# Patient Record
Sex: Male | Born: 1985 | Race: White | Hispanic: No | Marital: Married | Smoking: Former smoker
Health system: Southern US, Community
[De-identification: ages and names within clinical notes are randomized; demographics above are authoritative.]

## PROBLEM LIST (undated history)

## (undated) DIAGNOSIS — N200 Calculus of kidney: Secondary | ICD-10-CM

## (undated) DIAGNOSIS — L309 Dermatitis, unspecified: Secondary | ICD-10-CM

## (undated) HISTORY — PX: ANKLE SURGERY: SHX546

## (undated) HISTORY — DX: Dermatitis, unspecified: L30.9

---
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
## 2004-05-25 ENCOUNTER — Emergency Department (HOSPITAL_COMMUNITY): Admission: EM | Admit: 2004-05-25 | Discharge: 2004-05-25 | Payer: Self-pay | Admitting: *Deleted

## 2004-06-10 ENCOUNTER — Ambulatory Visit (HOSPITAL_COMMUNITY): Admission: RE | Admit: 2004-06-10 | Discharge: 2004-06-10 | Payer: Self-pay | Admitting: Orthopedic Surgery

## 2004-08-18 ENCOUNTER — Ambulatory Visit: Payer: Self-pay | Admitting: Orthopedic Surgery

## 2004-09-02 ENCOUNTER — Ambulatory Visit: Payer: Self-pay | Admitting: Orthopedic Surgery

## 2004-09-02 ENCOUNTER — Ambulatory Visit (HOSPITAL_COMMUNITY): Admission: RE | Admit: 2004-09-02 | Discharge: 2004-09-02 | Payer: Self-pay | Admitting: Orthopedic Surgery

## 2004-09-08 ENCOUNTER — Ambulatory Visit: Payer: Self-pay | Admitting: Orthopedic Surgery

## 2004-09-22 ENCOUNTER — Ambulatory Visit: Payer: Self-pay | Admitting: Orthopedic Surgery

## 2004-10-08 ENCOUNTER — Ambulatory Visit: Payer: Self-pay | Admitting: Orthopedic Surgery

## 2004-10-20 ENCOUNTER — Ambulatory Visit: Payer: Self-pay | Admitting: Orthopedic Surgery

## 2004-11-03 ENCOUNTER — Ambulatory Visit: Payer: Self-pay | Admitting: Orthopedic Surgery

## 2004-12-01 ENCOUNTER — Ambulatory Visit: Payer: Self-pay | Admitting: Orthopedic Surgery

## 2005-03-31 IMAGING — RF DG ANKLE PORT 2V*R*
1 series · 5 of 5 positions shown · non-contrast
Comparison: none

CLINICAL DATA: Right ankle fracture-dislocation.  ORIF.
PORTABLE RIGHT ANKLE ? 06/10/04 
Five spot fluoroscopic images are submitted from the operating room and correlated with radiographs done 05/25/04.  These demonstrate anatomic reduction of the distal fibular fracture by a lateral plate and screws.  Two cortical screws have reduced the medial malleolar fracture.  On the final image, which is a lateral view, there appears to be a third cortical screw traversing the distal tibiofibular joint.  This is not imaged in the AP projection.  
IMPRESSION
Near anatomic reduction of distal fibular and medial malleolar fractures.

[Series 935: run · 5 of 5 slices shown]
[im 1/5]
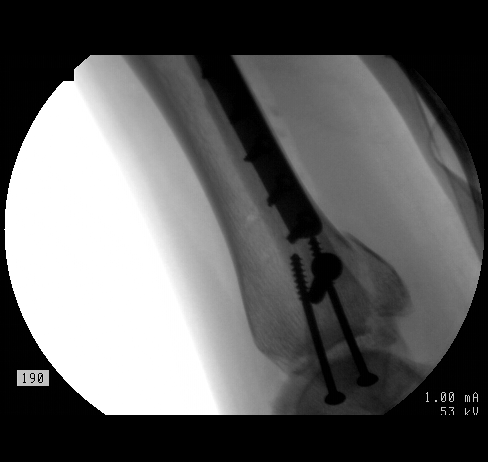
[im 2/5]
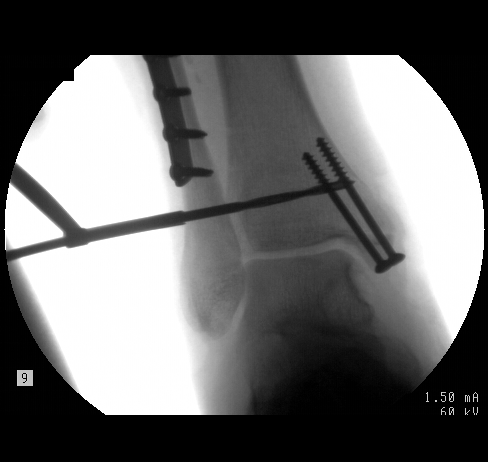
[im 3/5]
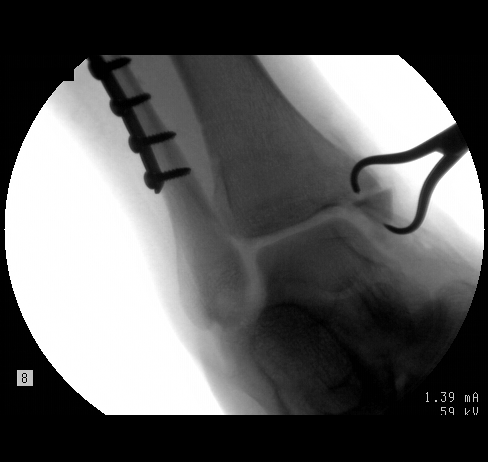
[im 4/5]
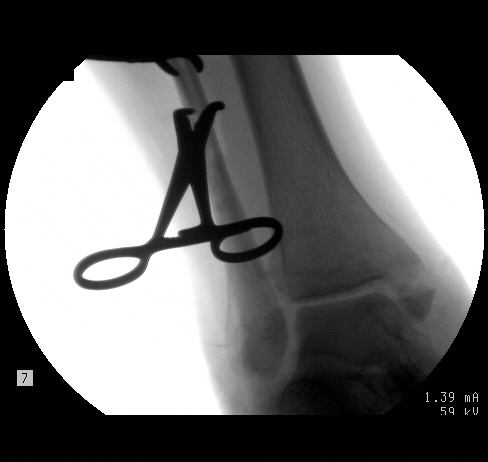
[im 5/5]
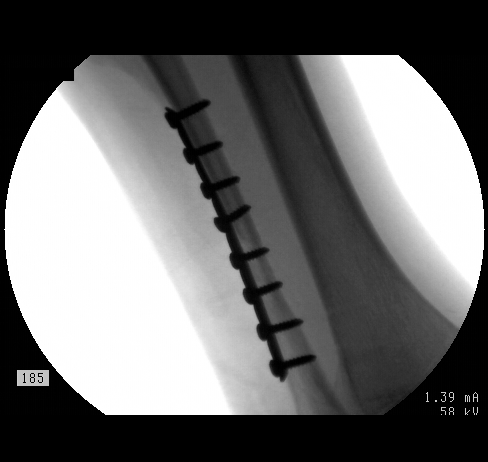

[5 of 5 positions shown; findings below may reference images not displayed]

## 2005-05-13 ENCOUNTER — Ambulatory Visit: Payer: Self-pay | Admitting: Orthopedic Surgery

## 2006-06-07 ENCOUNTER — Emergency Department (HOSPITAL_COMMUNITY): Admission: EM | Admit: 2006-06-07 | Discharge: 2006-06-07 | Payer: Self-pay | Admitting: Emergency Medicine

## 2006-07-23 ENCOUNTER — Encounter: Admission: RE | Admit: 2006-07-23 | Discharge: 2006-10-21 | Payer: Self-pay | Admitting: Orthopedic Surgery

## 2006-07-23 ENCOUNTER — Emergency Department (HOSPITAL_COMMUNITY): Admission: EM | Admit: 2006-07-23 | Discharge: 2006-07-23 | Payer: Self-pay | Admitting: Emergency Medicine

## 2008-02-18 ENCOUNTER — Emergency Department (HOSPITAL_COMMUNITY): Admission: EM | Admit: 2008-02-18 | Discharge: 2008-02-18 | Payer: Self-pay | Admitting: Emergency Medicine

## 2011-02-27 NOTE — H&P (Signed)
NAME:  Antonio Black, Antonio Black                          ACCOUNT NO.:  [id number]   MEDICAL RECORD NO.:  [id number]                   PATIENT TYPE:  AMB   LOCATION:  DAY                                  FACILITY:  [facility]   PHYSICIAN:  Vickki Hearing, M.D.           DATE OF BIRTH:  Jun 16, 1986   DATE OF ADMISSION:  DATE OF DISCHARGE:                                HISTORY & PHYSICAL   No dictation for this job.     ___________________________________________                                         Vickki Hearing, M.D.   SEH/MEDQ  D:  06/04/2004  T:  06/04/2004  Job:  [id number]

## 2011-02-27 NOTE — H&P (Signed)
NAME:  Antonio Black, MACPHERSON                          ACCOUNT NO.:  [id number]   MEDICAL RECORD NO.:  [id number]                   PATIENT TYPE:  INP   LOCATION:  A316                                 FACILITY:  [facility]   PHYSICIAN:  Dennie Maizes, M.D.                DATE OF BIRTH:  1986-02-21   DATE OF ADMISSION:  05/09/2002  DATE OF DISCHARGE:                                HISTORY & PHYSICAL   CHIEF COMPLAINT:  Severe right flank pain radiating to the front, nausea,  and vomiting.   HISTORY OF PRESENT ILLNESS:  This 25 year old male has been having  intermittent right flank pain of moderate severity for the past 10 days.  His pain became very severe a few days ago.  He was seen in the [facility] by Ellie Lunch, M.D.  An x-ray of the KUB area failed to reveal  any stone.  He was referred to me for further evaluation.  I saw the patient  in the office today.  A noncontrast CT scan of the abdomen and pelvis was  done.  The patient had severe recurrent pain associated with nausea and  vomiting.  He was admitted to the hospital for further evaluation and  management.  There is no past history of urolithiasis.  He did not have any  voiding difficulty, gross hematuria, fever, chills, or dysuria.   The noncontrast CT scan of the abdomen and pelvis revealed a 3 mm size,  noncontrast left renal calculus.  There was a 3 mm size right distal  ureteral calculus at the level of the ureterosacral junction with  obstruction.   PAST MEDICAL HISTORY:  Unremarkable.   MEDICATIONS:  None.   ALLERGIES:  AMOXICILLIN.   FAMILY HISTORY:  Strongly positive for recurrent ureterolithiasis.  There  also is a family history of hypertension, diabetes mellitus, and cancer of  the lymph  nodes.   PHYSICAL EXAMINATION:  The patient is noted to be in severe pain.   HEENT:  Normal.   NECK:  No masses.   LUNGS:  Clear to auscultation.   HEART:  Regular rate and rhythm.  No murmurs.   ABDOMEN:   Soft.  No palpable flank mass.  Moderate right costovertebral  angle tenderness is noted.   LABORATORY DATA:  Pending.   IMPRESSION:  1. Right renal colic.  2. Right distal ureteral calculus with obstruction.  3. Left renal calculus.   PLAN:  1. Admit to the patient to the hospital.  2. IV fluids.  3. Narcotics.  4. Strain all urine for stones.   I discussed with the patient and his mother regarding the management  options.  As he has had severe pain for several days, he would like to  proceed with endoscopic stone removal.  He is scheduled to undergo  cystoscopy, right retrograde pyelogram, right urethroscopic stone  extraction, and right urethral stent place on  May 10, 2002.  I have  explained to the patient and his mother regarding the diagnosis, operative  details, alterative treatments, and all possible risks and complications and  they have agree for the procedure to be done.  Inability to remove the stone  due to stone migration has been explained to the patient.                                               Dennie Maizes, M.D.    SK/MEDQ  D:  05/09/2002  T:  05/09/2002  Job:  [id number]   cc:   Ellie Lunch, M.D.   [facility]
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
## 2011-02-27 NOTE — Op Note (Signed)
[name], [name] NO.:  [id number]   MEDICAL RECORD NO.:  [id number]          PATIENT TYPE:  AMB   LOCATION:  DAY                           FACILITY:  [facility]   PHYSICIAN:  Vickki Hearing, M.D.DATE OF BIRTH:  11-28-1985   DATE OF PROCEDURE:  09/02/2004  DATE OF DISCHARGE:                                 OPERATIVE REPORT   PREOPERATIVE DIAGNOSIS:  Fractured right ankle, orthopedic implant.   POSTOPERATIVE DIAGNOSIS:  Fractured right ankle, orthopedic implant.   PROCEDURE:  Removal of syndesmotic screw.   SURGEON:  Vickki Hearing, M.D.   No assistants.  No specimens.   ESTIMATED BLOOD LOSS:  Minimal.   TOURNIQUET TIME:  11 minutes.   No complications. The patient went to PACU in good condition.   DETAILS OF PROCEDURE:  Antonio Black was identified in the preop holding area  and marked his right ankle as the operative site.  My initials were signed  in the same spot.  He was given preoperative antibiotics, taken to the  operating room for general anesthetic after sterile prep and drape.  Time  out was taken and the procedure and limb and patient identity were  confirmed.   The tourniquet was elevated.  A mini-C-arm was used to identify the location  of the screw.  An incision was made over that location.  The screw was  removed.  Just prior to removing the screw from the fibula, a rotation check  was made to check syndesmotic integrity.  There was no motion in the  syndesmosis, and the screw was removed from the fibula.  The wound was  irrigated and closed with 2-0 Vicryl and staples, injected with 10 mL of  0.5% plain Sensorcaine.  A sterile dressing was applied and the tourniquet  was released.  The patient was extubated and taken to the recovery room in  good condition.     Weyman Croon   SEH/MEDQ  D:  09/02/2004  T:  09/02/2004  Job:  [id number]
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
## 2011-02-27 NOTE — Op Note (Signed)
NAME:  Antonio Black, Antonio Black                          ACCOUNT NO.:  [id number]   MEDICAL RECORD NO.:  [id number]                   PATIENT TYPE:  INP   LOCATION:  A316                                 FACILITY:  [facility]   PHYSICIAN:  Dennie Maizes, M.D.                DATE OF BIRTH:  1986-04-03   DATE OF PROCEDURE:  05/10/2002  DATE OF DISCHARGE:  05/11/2002                                 OPERATIVE REPORT   PREOPERATIVE DIAGNOSIS:  Right distal ureteral calculus with obstruction,  right renal colic.   POSTOPERATIVE DIAGNOSIS:  Right distal ureteral calculus with obstruction,  right renal colic.   OPERATIVE PROCEDURE:  Cystoscopy, right retrograde pyelogram, ureteroscopy  with stone extraction and right ureteral stent placement.   ANESTHESIA:  General.   SURGEON:  Dennie Maizes, M.D.   COMPLICATIONS:  None.   INDICATIONS:  This 25 year old male was admitted to the hospital with right  renal colic.  Evaluation revealed a 3 mm sized right distal ureteral  calculus with obstruction and mild hydronephrosis.  The patient was taken to  the OR and cleared for cystoscopy, right retrograde pyelogram, ureteroscopy,  stone extraction and ureteral stent placement.   DESCRIPTION OF PROCEDURE:  General anesthesia was induced and the patient  was placed on the OR table in the dorsal lithotomy position.  The lower  abdomen and genitalia were prepped and draped in a sterile fashion.  Cystoscopy was done with a 22-French scope.  Ureter, prostate and bladder  were normal.  There was prominence of the right intramural ureter with edema  and erythema.  A 5-French Burch catheter was then placed in the right  ureteral orifice.  About 7 cc of Renografin-60 were injected into collecting  system and a retrograde pyelogram was done; this revealed a small filling  defect in the distal ureter about 2 cm above the ureteral orifice.  The  collecting system above the filling defect was noted to be mildly  dilated.   A 5-French open-ended catheter was then placed in the right ureteral  orifice.  A 0.038 inch Bentson guidewire with a flexible tip was then  advanced into the renal pelvis without any difficulty.  Distal ureter was  then dilated using a 18-French 4 cm sized balloon dilating catheter.  The  guidewire was left in place and the balloon dilating catheter was removed.   Ureteroscopy was done with the rigid ureteroscope.  A 3 mm sized stone was  seen in the distal ureter.  A #6 wire 11 mm sized basket was then inserted  into the distal ureter.  The stone was trapped inside the basket and removed  without any difficulty.  A 6-French 26 cm sized stent with a string was then  inserted into the right collecting system.  The instruments were removed.   The patient was transferred to the PACU in a satisfactory condition.  Dennie Maizes, M.D.    SK/MEDQ  D:  05/10/2002  T:  05/15/2002  Job:  [id number]

## 2011-07-08 LAB — DIFFERENTIAL
Basophils Absolute: 0.1
Eosinophils Absolute: 0.2
Eosinophils Relative: 3
Monocytes Absolute: 0.5
Monocytes Relative: 7

## 2011-07-08 LAB — URINALYSIS, ROUTINE W REFLEX MICROSCOPIC
Leukocytes, UA: NEGATIVE
Protein, ur: 30 — AB
Specific Gravity, Urine: 1.025

## 2011-07-08 LAB — CBC
HCT: 42.4
Hemoglobin: 15.1

## 2011-07-08 LAB — URINE MICROSCOPIC-ADD ON

## 2011-07-08 LAB — BASIC METABOLIC PANEL
CO2: 27
Calcium: 9.1
Chloride: 106
Creatinine, Ser: 0.91
GFR calc non Af Amer: >60
Glucose, Bld: 99
Potassium: 3.8
Sodium: 139

## 2012-08-20 ENCOUNTER — Emergency Department (HOSPITAL_COMMUNITY): Payer: BC Managed Care – PPO

## 2012-08-20 ENCOUNTER — Emergency Department (HOSPITAL_COMMUNITY)
Admission: EM | Admit: 2012-08-20 | Discharge: 2012-08-20 | Disposition: A | Discharge status: Home / Self Care | Payer: BC Managed Care – PPO | Attending: Emergency Medicine | Admitting: Emergency Medicine

## 2012-08-20 ENCOUNTER — Encounter (HOSPITAL_COMMUNITY): Payer: Self-pay | Admitting: *Deleted

## 2012-08-20 DIAGNOSIS — F17200 Nicotine dependence, unspecified, uncomplicated: Secondary | ICD-10-CM | POA: Insufficient documentation

## 2012-08-20 DIAGNOSIS — Z87442 Personal history of urinary calculi: Secondary | ICD-10-CM | POA: Insufficient documentation

## 2012-08-20 DIAGNOSIS — N200 Calculus of kidney: Secondary | ICD-10-CM | POA: Insufficient documentation

## 2012-08-20 HISTORY — DX: Calculus of kidney: N20.0

## 2012-08-20 LAB — URINALYSIS, ROUTINE W REFLEX MICROSCOPIC
Bilirubin Urine: NEGATIVE
Glucose, UA: NEGATIVE mg/dL
Ketones, ur: NEGATIVE mg/dL
Protein, ur: NEGATIVE mg/dL
Specific Gravity, Urine: 1.015 (ref 1.005–1.030)
pH: 6 (ref 5.0–8.0)

## 2012-08-20 LAB — URINE MICROSCOPIC-ADD ON

## 2012-08-20 MED ORDER — PERCOCET 5-325 MG PO TABS: ORAL_TABLET | ORAL | Status: DC

## 2012-08-20 MED ORDER — ONDANSETRON 8 MG PO TBDP
8.0000 mg | ORAL_TABLET | Freq: Once | ORAL | Status: AC
  Administered 2012-08-20: 8 mg via ORAL
  Filled 2012-08-20: qty 1

## 2012-08-20 MED ORDER — PROMETHAZINE HCL 25 MG RE SUPP: 25.0000 mg | Freq: Four times a day (QID) | RECTAL | Status: DC | PRN

## 2012-08-20 MED ORDER — PROMETHAZINE HCL 25 MG PO TABS: 25.0000 mg | ORAL_TABLET | Freq: Three times a day (TID) | ORAL | Status: DC | PRN

## 2012-08-20 MED ORDER — TAMSULOSIN HCL 0.4 MG PO CAPS: ORAL_CAPSULE | ORAL | Status: DC

## 2012-08-20 MED ORDER — MORPHINE SULFATE 4 MG/ML IJ SOLN
4.0000 mg | Freq: Once | INTRAMUSCULAR | Status: AC
  Administered 2012-08-20: 4 mg via INTRAMUSCULAR
  Filled 2012-08-20: qty 1

## 2012-08-20 NOTE — ED Notes (Signed)
Pt woke up with left flank pain this morning. Hx of kidney stones and has noticed blood in his urine previous 3 days but not today. Nausea this morning.

## 2012-08-20 NOTE — ED Provider Notes (Signed)
History     CSN: [id number]  Arrival date & time 08/20/12  1323   First MD Initiated Contact with Patient 08/20/12 1434      Chief Complaint  Patient presents with  . Flank Pain    (Consider location/radiation/quality/duration/timing/severity/associated sxs/prior treatment) HPI  Patient relates a history of renal calculi and has had lithotripsy and stents in the past. He states his stones have always been on the right, however he was told he did have a stone on the left but they were just watching. He states about 10 AM today started having pain in his left flank that would not get better. The pain was described as sharp. He relates however on the way to the ED the pain did improve however it is not gone. He states his pain was a 6/10 at worst and is currently at 2/10. He states certain movements make it feel worse and sitting still makes it feel better. He has had  nausea without vomiting, he did have some diaphoresis, his girl friend states he was pale. He states it feels like his prior stones except this time it on the left side. He denies radiation into his abdomen or groin.   PCP [facility] in [place] Urologist was Dr. Rito Ehrlich  Past Medical History  Diagnosis Date  . Kidney stones     Past Surgical History  Procedure Date  . Ankle surgery     No family history on file.  History  Substance Use Topics  . Smoking status: Current Every Day Smoker  . Smokeless tobacco: Not on file  . Alcohol Use: occassional   employed   Review of Systems  All other systems reviewed and are negative.    Allergies  Amoxicillin  Home Medications   Current Outpatient Rx  Name  Route  Sig  Dispense  Refill  . ONE-DAILY MULTI VITAMINS PO TABS   Oral   Take 1 tablet by mouth daily.           BP 123/67  Pulse 77  Temp 98.1 F (36.7 C) (Oral)  Resp 16  Ht 5\' 11"  (1.803 m)  Wt 205 lb (92.987 kg)  BMI 28.59 kg/m2  SpO2 100%  Vital signs normal    Physical  Exam  Nursing note and vitals reviewed. Constitutional: He is oriented to person, place, and time. He appears well-developed and well-nourished.  Non-toxic appearance. He does not appear ill. No distress.  HENT:  Head: Normocephalic and atraumatic.  Right Ear: External ear normal.  Left Ear: External ear normal.  Nose: Nose normal. No mucosal edema or rhinorrhea.  Mouth/Throat: Oropharynx is clear and moist and mucous membranes are normal. No dental abscesses or uvula swelling.  Eyes: Conjunctivae normal and EOM are normal. Pupils are equal, round, and reactive to light.  Neck: Normal range of motion and full passive range of motion without pain. Neck supple.  Cardiovascular: Normal rate, regular rhythm and normal heart sounds.  Exam reveals no gallop and no friction rub.   No murmur heard. Pulmonary/Chest: Effort normal and breath sounds normal. No respiratory distress. He has no wheezes. He has no rhonchi. He has no rales. He exhibits no tenderness and no crepitus.  Abdominal: Soft. Normal appearance and bowel sounds are normal. He exhibits no distension. There is no tenderness. There is no rebound and no guarding.  Genitourinary:       Some pain in left flank  Musculoskeletal: Normal range of motion. He exhibits no edema and  no tenderness.       Moves all extremities well.   Neurological: He is alert and oriented to person, place, and time. He has normal strength. No cranial nerve deficit.  Skin: Skin is warm, dry and intact. No rash noted. No erythema. There is pallor.  Psychiatric: He has a normal mood and affect. His speech is normal and behavior is normal. His mood appears not anxious.    ED Course  Procedures (including critical care time)   Medications  Multiple Vitamin (MULTIVITAMIN) tablet (not administered)  morphine 4 MG/ML injection 4 mg (4 mg Intramuscular Given 08/20/12 1451)  ondansetron (ZOFRAN-ODT) disintegrating tablet 8 mg (8 mg Oral Given 08/20/12 1451)      Discussed with patient about the best test to do today. He has had at least AP CT x 3 in the past. Since his pain is improved he is agreeable to getting an xray, knowing it isn't the best test, and coming back tomorrow to get an [place]. However, we discussed if his pain seems worse, we can also consider getting a CT scan.  Review of his CT scan from 2009 shows a 4 mm nonobstructing stone in the left kidney that was bigger than the prior scan in 2003.  Pt aware of these scan results today.    Recheck at 16:10 pt states his pain is a 0.5/10 and is ready to go home. Have discussed his xray results. Understands to return if he gets worse.   Results for orders placed during the hospital encounter of 08/20/12  URINALYSIS, ROUTINE W REFLEX MICROSCOPIC      Component Value Range   Color, Urine YELLOW  YELLOW   APPearance CLEAR  CLEAR   Specific Gravity, Urine 1.015  1.005 - 1.030   pH 6.0  5.0 - 8.0   Glucose, UA NEGATIVE  NEGATIVE mg/dL   Hgb urine dipstick LARGE (*) NEGATIVE   Bilirubin Urine NEGATIVE  NEGATIVE   Ketones, ur NEGATIVE  NEGATIVE mg/dL   Protein, ur NEGATIVE  NEGATIVE mg/dL   Urobilinogen, UA 0.2  0.0 - 1.0 mg/dL   Nitrite NEGATIVE  NEGATIVE   Leukocytes, UA NEGATIVE  NEGATIVE  URINE MICROSCOPIC-ADD ON      Component Value Range   WBC, UA 3-6  <3 WBC/hpf   RBC / HPF TOO NUMEROUS TO COUNT  <3 RBC/hpf   Laboratory interpretation all normal except hematuria    Dg Abd 1 View  08/20/2012  *RADIOLOGY REPORT*  Clinical Data: Left flank pain.  History of kidney stone.  ABDOMEN - 1 VIEW  Comparison: CT 02/18/2008  Findings: Again demonstrated is a 6 mm stone in the lower pole of the left kidney.  No other urinary tract calcification is evident. No stones seen along the course of either ureter.  There is a small phlebolith in the right pelvis.  There is mild curvature of the spine.  IMPRESSION: 6 mm stone in the lower pole of the left kidney, unchanged in position from the CT scan of  2009.  No other sign of urinary tract stone.   Original Report Authenticated By: Paulina Fusi, M.D.      1. Renal calculus or stone     New Prescriptions   PERCOCET 5-325 MG PER TABLET    Take 1 or 2 po Q 6hrs for pain   PROMETHAZINE (PHENERGAN) 25 MG SUPPOSITORY    Place 1 suppository (25 mg total) rectally every 6 (six) hours as needed for nausea.   PROMETHAZINE (PHENERGAN)  25 MG TABLET    Take 1 tablet (25 mg total) by mouth every 8 (eight) hours as needed for nausea.   TAMSULOSIN HCL (FLOMAX) 0.4 MG CAPS    Take 1 po QD until you pass the stone.    Plan discharge to return at 9 am for outpatient renal US.   Devoria Albe, MD, FACEP    MDM          Ward Givens, MD 08/20/12 [phone]

## 2012-08-21 ENCOUNTER — Ambulatory Visit (HOSPITAL_COMMUNITY)
Admit: 2012-08-21 | Discharge: 2012-08-21 | Disposition: A | Discharge status: Home / Self Care | Payer: BC Managed Care – PPO | Attending: Emergency Medicine | Admitting: Emergency Medicine

## 2012-08-21 DIAGNOSIS — R109 Unspecified abdominal pain: Secondary | ICD-10-CM | POA: Insufficient documentation

## 2012-08-21 DIAGNOSIS — Z87442 Personal history of urinary calculi: Secondary | ICD-10-CM | POA: Insufficient documentation

## 2012-08-30 ENCOUNTER — Emergency Department (HOSPITAL_BASED_OUTPATIENT_CLINIC_OR_DEPARTMENT_OTHER)
Admission: EM | Admit: 2012-08-30 | Discharge: 2012-08-30 | Disposition: A | Discharge status: Home / Self Care | Payer: BC Managed Care – PPO | Attending: Emergency Medicine | Admitting: Emergency Medicine

## 2012-08-30 ENCOUNTER — Emergency Department (HOSPITAL_BASED_OUTPATIENT_CLINIC_OR_DEPARTMENT_OTHER): Payer: BC Managed Care – PPO

## 2012-08-30 ENCOUNTER — Encounter (HOSPITAL_BASED_OUTPATIENT_CLINIC_OR_DEPARTMENT_OTHER): Payer: Self-pay

## 2012-08-30 DIAGNOSIS — S93409A Sprain of unspecified ligament of unspecified ankle, initial encounter: Secondary | ICD-10-CM | POA: Insufficient documentation

## 2012-08-30 DIAGNOSIS — Y929 Unspecified place or not applicable: Secondary | ICD-10-CM | POA: Insufficient documentation

## 2012-08-30 DIAGNOSIS — Y939 Activity, unspecified: Secondary | ICD-10-CM | POA: Insufficient documentation

## 2012-08-30 DIAGNOSIS — F17200 Nicotine dependence, unspecified, uncomplicated: Secondary | ICD-10-CM | POA: Insufficient documentation

## 2012-08-30 DIAGNOSIS — X500XXA Overexertion from strenuous movement or load, initial encounter: Secondary | ICD-10-CM | POA: Insufficient documentation

## 2012-08-30 DIAGNOSIS — N200 Calculus of kidney: Secondary | ICD-10-CM | POA: Insufficient documentation
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
## 2012-12-29 ENCOUNTER — Telehealth: Payer: Self-pay | Admitting: Physician Assistant

## 2012-12-29 ENCOUNTER — Encounter: Payer: Self-pay | Admitting: Physician Assistant

## 2012-12-29 ENCOUNTER — Ambulatory Visit (INDEPENDENT_AMBULATORY_CARE_PROVIDER_SITE_OTHER): Payer: BC Managed Care – PPO | Admitting: Physician Assistant

## 2012-12-29 VITALS — BP 113/71 | HR 74 | Temp 97.8°F | Ht 70.5 in | Wt 199.6 lb

## 2012-12-29 DIAGNOSIS — J329 Chronic sinusitis, unspecified: Secondary | ICD-10-CM

## 2012-12-29 MED ORDER — AMOXICILLIN 875 MG PO TABS: 875.0000 mg | ORAL_TABLET | Freq: Two times a day (BID) | ORAL | Status: DC

## 2012-12-29 MED ORDER — FLUTICASONE PROPIONATE 50 MCG/ACT NA SUSP: 2.0000 | Freq: Every day | NASAL | Status: DC

## 2012-12-29 NOTE — Progress Notes (Signed)
  Subjective:    Patient ID: DAHLTON HINDE, male    DOB: 08/08/1986, 27 y.o.   MRN: [id number]  HPI Head congestion, dry cough, sore throat, poor sleep 2 days   Review of Systems  Constitutional: Positive for chills, activity change and fatigue.  HENT: Positive for congestion, sore throat, rhinorrhea, postnasal drip and sinus pressure. Negative for hearing loss, nosebleeds and tinnitus.   Eyes: Negative.   Respiratory: Positive for cough. Negative for shortness of breath and wheezing.   Cardiovascular: Negative.   Gastrointestinal: Negative.   Genitourinary: Negative.   All other systems reviewed and are negative.       Objective:   Physical Exam  Constitutional: He is oriented to person, place, and time. He appears well-developed and well-nourished.  HENT:  maxofacial tenderness, TMs retracted, pharynx injected  Eyes: Conjunctivae are normal. Pupils are equal, round, and reactive to light.  Neck: Normal range of motion. Neck supple.  Cardiovascular: Normal rate, regular rhythm and normal heart sounds.   Pulmonary/Chest:  Dry cough, worse at night  Neurological: He is alert and oriented to person, place, and time.  Skin: Skin is warm and dry.  Psychiatric: He has a normal mood and affect.          Assessment & Plan:  Sinusitis

## 2012-12-29 NOTE — Telephone Encounter (Signed)
Antonio Black gave him amoxicillin.  He is allergic to it.  Please call something else in to [facility]

## 2012-12-30 ENCOUNTER — Other Ambulatory Visit: Payer: Self-pay | Admitting: Physician Assistant

## 2012-12-30 DIAGNOSIS — J329 Chronic sinusitis, unspecified: Secondary | ICD-10-CM

## 2012-12-30 MED ORDER — AZITHROMYCIN 250 MG PO TABS: ORAL_TABLET | ORAL | Status: DC

## 2012-12-30 NOTE — Telephone Encounter (Signed)
z-pack printed; fax to [facility]

## 2012-12-30 NOTE — Telephone Encounter (Signed)
Z-pak called to [facility] in [place].  Patient aware.

## 2013-03-01 ENCOUNTER — Telehealth: Payer: Self-pay | Admitting: Nurse Practitioner

## 2013-03-01 NOTE — Telephone Encounter (Signed)
APPT MADE

## 2013-03-02 ENCOUNTER — Encounter: Payer: Self-pay | Admitting: Physician Assistant

## 2013-03-02 ENCOUNTER — Ambulatory Visit (INDEPENDENT_AMBULATORY_CARE_PROVIDER_SITE_OTHER): Payer: BC Managed Care – PPO | Admitting: Physician Assistant

## 2013-03-02 VITALS — BP 119/74 | HR 76 | Temp 97.9°F | Ht 70.0 in | Wt 198.2 lb

## 2013-03-02 DIAGNOSIS — L259 Unspecified contact dermatitis, unspecified cause: Secondary | ICD-10-CM

## 2013-03-02 DIAGNOSIS — L309 Dermatitis, unspecified: Secondary | ICD-10-CM

## 2013-03-02 NOTE — Progress Notes (Signed)
Subjective:     Patient ID: Antonio Black, male   DOB: [birth year], 27 y.o.   MRN: [id number]  HPI Pt with a 1 yr hx of rash to the ant tib area on the L leg He denies any real trauma to the area + pruritus to the area Over the last yr it has increased in size He has used lotion and OTC hydrocort which he states makes the area darker but it never resolves No FH of skin disorders  Review of Systems  All other systems reviewed and are negative.       Objective:   Physical Exam Large eythem lesion to the L ant shin Well dermarc. Edges No ulceration. + scale Smaller lesion to the R ant shin    Assessment:     1. Dermatitis        Plan:     Referral to Derm He can use Hydrocort prn Appt to [facility]

## 2013-03-02 NOTE — Patient Instructions (Signed)
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
## 2014-06-14 ENCOUNTER — Encounter (INDEPENDENT_AMBULATORY_CARE_PROVIDER_SITE_OTHER): Payer: Self-pay

## 2014-06-14 ENCOUNTER — Ambulatory Visit (INDEPENDENT_AMBULATORY_CARE_PROVIDER_SITE_OTHER): Payer: BC Managed Care – PPO | Admitting: Family

## 2014-06-14 ENCOUNTER — Encounter: Payer: Self-pay | Admitting: Family

## 2014-06-14 VITALS — BP 115/66 | HR 62 | Temp 99.3°F | Ht 70.0 in | Wt 182.6 lb

## 2014-06-14 DIAGNOSIS — B353 Tinea pedis: Secondary | ICD-10-CM

## 2014-06-14 MED ORDER — FLUCONAZOLE 150 MG PO TABS: 150.0000 mg | ORAL_TABLET | ORAL | Status: DC

## 2014-06-14 MED ORDER — CLOTRIMAZOLE 1 % EX CREA: 1.0000 "application " | TOPICAL_CREAM | Freq: Two times a day (BID) | CUTANEOUS | Status: DC

## 2014-06-14 NOTE — Patient Instructions (Signed)

## 2014-06-14 NOTE — Progress Notes (Signed)
   Subjective:    Patient ID: Antonio Black, male    DOB: Mar 06, 1986, 28 y.o.   MRN: [id number]  HPI Pt presents to the office for a yellow fungus on bottom of bilateral feet. Pt states it started a few years ago, but it was smaller and only on one foot. Pt states it just has gotten worse. Pt denies any itching. Pt states the right foot is slightly tender when he walks. Pt states he has tried to "scrap the dry skin" , but it just grows back.    Review of Systems  Constitutional: Negative.   HENT: Negative.   Respiratory: Negative.   Cardiovascular: Negative.   Gastrointestinal: Negative.   Endocrine: Negative.   Genitourinary: Negative.   Musculoskeletal: Negative.   Neurological: Negative.   Hematological: Negative.   Psychiatric/Behavioral: Negative.   All other systems reviewed and are negative.      Objective:   Physical Exam  Vitals reviewed. Constitutional: He is oriented to person, place, and time. He appears well-developed and well-nourished. No distress.  Eyes: Pupils are equal, round, and reactive to light. Right eye exhibits no discharge. Left eye exhibits no discharge.  Neck: Normal range of motion. Neck supple. No thyromegaly present.  Cardiovascular: Normal rate, regular rhythm, normal heart sounds and intact distal pulses.   No murmur heard. Pulmonary/Chest: Effort normal and breath sounds normal. No respiratory distress. He has no wheezes.  Abdominal: Soft. Bowel sounds are normal. He exhibits no distension. There is no tenderness.  Musculoskeletal: Normal range of motion. He exhibits no edema and no tenderness.  Neurological: He is alert and oriented to person, place, and time. He has normal reflexes. No cranial nerve deficit.  Skin: Skin is warm and dry. No erythema.  Dry yellowish callus like on bilateral plantars  Psychiatric: He has a normal mood and affect. His behavior is normal. Judgment and thought content normal.    BP 115/66  Pulse 62  Temp(Src)  99.3 F (37.4 C) (Oral)  Ht  (1.778 m)  Wt 182 lb 9.6 oz (82.827 kg)  BMI 26.20 kg/m2       Assessment & Plan:  1. Tinea pedis of both feet -Keep feet clean and dry -Wash socks and shoes -Do not walk around without shoes - clotrimazole (LOTRIMIN) 1 % cream; Apply 1 application topically 2 (two) times daily.  Dispense: 30 g; Refill: 0 - fluconazole (DIFLUCAN) 150 MG tablet; Take 1 tablet (150 mg total) by mouth once a week.  Dispense: 5 tablet; Refill: 0   Jannifer Rodney, FNP

## 2016-06-11 DIAGNOSIS — J039 Acute tonsillitis, unspecified: Secondary | ICD-10-CM | POA: Diagnosis not present

## 2017-08-26 DIAGNOSIS — R5383 Other fatigue: Secondary | ICD-10-CM | POA: Diagnosis not present

## 2017-08-26 DIAGNOSIS — L308 Other specified dermatitis: Secondary | ICD-10-CM | POA: Diagnosis not present

## 2017-08-26 DIAGNOSIS — R7989 Other specified abnormal findings of blood chemistry: Secondary | ICD-10-CM | POA: Diagnosis not present

## 2017-12-28 DIAGNOSIS — H10023 Other mucopurulent conjunctivitis, bilateral: Secondary | ICD-10-CM | POA: Diagnosis not present

## 2017-12-28 DIAGNOSIS — H6692 Otitis media, unspecified, left ear: Secondary | ICD-10-CM | POA: Diagnosis not present

## 2018-05-03 DIAGNOSIS — E291 Testicular hypofunction: Secondary | ICD-10-CM | POA: Diagnosis not present

## 2018-05-03 DIAGNOSIS — R7989 Other specified abnormal findings of blood chemistry: Secondary | ICD-10-CM | POA: Diagnosis not present

## 2018-05-13 ENCOUNTER — Other Ambulatory Visit: Payer: Self-pay

## 2018-05-13 ENCOUNTER — Encounter (HOSPITAL_BASED_OUTPATIENT_CLINIC_OR_DEPARTMENT_OTHER): Payer: Self-pay | Admitting: *Deleted

## 2018-05-13 ENCOUNTER — Emergency Department (HOSPITAL_BASED_OUTPATIENT_CLINIC_OR_DEPARTMENT_OTHER)
Admission: EM | Admit: 2018-05-13 | Discharge: 2018-05-13 | Disposition: A | Discharge status: Home / Self Care | Payer: BLUE CROSS/BLUE SHIELD | Attending: Emergency Medicine | Admitting: Emergency Medicine

## 2018-05-13 DIAGNOSIS — Z79899 Other long term (current) drug therapy: Secondary | ICD-10-CM | POA: Insufficient documentation

## 2018-05-13 DIAGNOSIS — R6883 Chills (without fever): Secondary | ICD-10-CM | POA: Insufficient documentation

## 2018-05-13 DIAGNOSIS — R5383 Other fatigue: Secondary | ICD-10-CM | POA: Insufficient documentation

## 2018-05-13 DIAGNOSIS — Z87891 Personal history of nicotine dependence: Secondary | ICD-10-CM | POA: Diagnosis not present

## 2018-05-13 DIAGNOSIS — R630 Anorexia: Secondary | ICD-10-CM | POA: Insufficient documentation

## 2018-05-13 DIAGNOSIS — R61 Generalized hyperhidrosis: Secondary | ICD-10-CM | POA: Diagnosis not present

## 2018-05-13 LAB — COMPREHENSIVE METABOLIC PANEL
ALT: 23 U/L (ref 0–44)
ANION GAP: 8 (ref 5–15)
AST: 19 U/L (ref 15–41)
Albumin: 5.2 g/dL — ABNORMAL HIGH (ref 3.5–5.0)
Alkaline Phosphatase: 69 U/L (ref 38–126)
BUN: 17 mg/dL (ref 6–20)
CALCIUM: 9.4 mg/dL (ref 8.9–10.3)
CO2: 30 mmol/L (ref 22–32)
CREATININE: 0.92 mg/dL (ref 0.61–1.24)
Chloride: 102 mmol/L (ref 98–111)
GFR calc Af Amer: >60 mL/min (ref >60)
GLUCOSE: 90 mg/dL (ref 70–99)
Potassium: 3.6 mmol/L (ref 3.5–5.1)
Sodium: 140 mmol/L (ref 135–145)
TOTAL PROTEIN: 7.5 g/dL (ref 6.5–8.1)
Total Bilirubin: 0.6 mg/dL (ref 0.3–1.2)

## 2018-05-13 LAB — CBC WITH DIFFERENTIAL/PLATELET
BASOS ABS: 0.1 10*3/uL (ref 0.0–0.1)
Basophils Relative: 1 %
Eosinophils Absolute: 0.2 10*3/uL (ref 0.0–0.7)
Eosinophils Relative: 3 %
HEMATOCRIT: 43.8 % (ref 39.0–52.0)
Hemoglobin: 14.7 g/dL (ref 13.0–17.0)
LYMPHS ABS: 2.1 10*3/uL (ref 0.7–4.0)
Lymphocytes Relative: 28 %
MCH: 29.8 pg (ref 26.0–34.0)
MCHC: 33.6 g/dL (ref 30.0–36.0)
MCV: 88.7 fL (ref 78.0–100.0)
MONO ABS: 0.5 10*3/uL (ref 0.1–1.0)
Monocytes Relative: 6 %
NEUTROS ABS: 4.7 10*3/uL (ref 1.7–7.7)
Neutrophils Relative %: 62 %
Platelets: 278 10*3/uL (ref 150–400)
RBC: 4.94 MIL/uL (ref 4.22–5.81)
RDW: 13.6 % (ref 11.5–15.5)
WBC: 7.7 10*3/uL (ref 4.0–10.5)

## 2018-05-13 LAB — URINALYSIS, ROUTINE W REFLEX MICROSCOPIC
Bilirubin Urine: NEGATIVE
Glucose, UA: NEGATIVE mg/dL
Hgb urine dipstick: NEGATIVE
KETONES UR: NEGATIVE mg/dL
Leukocytes, UA: NEGATIVE
Nitrite: NEGATIVE
PROTEIN: NEGATIVE mg/dL
Specific Gravity, Urine: 1.01 (ref 1.005–1.030)
pH: 7.5 (ref 5.0–8.0)

## 2018-05-13 LAB — TSH: TSH: 0.87 u[IU]/mL (ref 0.350–4.500)

## 2018-05-13 NOTE — ED Triage Notes (Signed)
Pt c/o generalized weakness and fatigue, sent here from PMD office  For eval

## 2018-05-13 NOTE — ED Notes (Signed)
3 days ago states started to feel tired, had some nausea no vomiting, , has had diarrhea, no cp or abd pain ,  Or dysuria,  States got sweaty this am .  Works 2  Jobs he states  In [employer]

## 2018-05-13 NOTE — ED Provider Notes (Signed)
[facility] EMERGENCY DEPARTMENT Provider Note   CSN: [id number] Arrival date & time: 05/13/18  1444     History   Chief Complaint Chief Complaint  Patient presents with  . Fatigue    HPI Antonio Black is a 32 y.o. male who complains of fatigue.  Patient states that for the past 3 days he has had symptoms of generalized fatigue that he can be best described as "feeling like my blood sugar is low."  He has no history of hypo ischemia or diabetes.  He denies polyuria polydipsia polyphagia.  He has had decreased appetite, has been sleeping more and had some chills yesterday without fever, urinary symptoms, abdominal pain, back pain, headaches, neck stiffness, rashes, tick bites.  He is not taking any new medications.  He denies stopping use of stimulants such as caffeine.  He denies alcohol abuse or drug abuse.  He denies depression.  He is working about 80 hours a week but just came off of a week long vacation about 6 days ago.  He eats 3 meals a day and gets about 6 hours of sleep nightly  HPI  Past Medical History:  Diagnosis Date  . Kidney stones     Patient Active Problem List   Diagnosis Date Noted  . Unspecified sinusitis (chronic) 12/29/2012    Past Surgical History:  Procedure Laterality Date  . ANKLE SURGERY          Home Medications    Prior to Admission medications   Medication Sig Start Date End Date Taking? Authorizing Provider  clotrimazole (LOTRIMIN) 1 % cream Apply 1 application topically 2 (two) times daily. 06/14/14   Jannifer Rodney A, FNP  fluconazole (DIFLUCAN) 150 MG tablet Take 1 tablet (150 mg total) by mouth once a week. 06/14/14   Junie Spencer, FNP  HYDROcodone-acetaminophen (NORCO/VICODIN) 5-325 MG per tablet Take 1 tablet by mouth every 8 (eight) hours as needed for pain.    [provider]  Multiple Vitamin (MULTIVITAMIN) tablet Take 1 tablet by mouth daily.    [provider]  PERCOCET 5-325 MG per tablet Take 1 or  2 po Q 6hrs for pain 08/20/12   Devoria Albe, MD  promethazine (PHENERGAN) 25 MG suppository Place 1 suppository (25 mg total) rectally every 6 (six) hours as needed for nausea. 08/20/12   Devoria Albe, MD  promethazine (PHENERGAN) 25 MG tablet Take 1 tablet (25 mg total) by mouth every 8 (eight) hours as needed for nausea. 08/20/12   Devoria Albe, MD  Tamsulosin HCl (FLOMAX) 0.4 MG CAPS Take 1 po QD until you pass the stone. 08/20/12   Devoria Albe, MD    Family History Family History  Problem Relation Age of Onset  . Hypertension Mother   . Alcohol abuse Father     Social History Social History   Tobacco Use  . Smoking status: Former Games developer  . Smokeless tobacco: Never Used  Substance Use Topics  . Alcohol use: No  . Drug use: No     Allergies   Amoxicillin   Review of Systems Review of Systems Ten systems reviewed and are negative for acute change, except as noted in the HPI.    Physical Exam Updated Vital Signs BP 124/81 (BP Location: Left Arm)   Pulse 62   Temp 98 F (36.7 C) (Oral)   Resp 16   Ht 5\' 9"  (1.753 m)   Wt 86.2 kg (190 lb)   SpO2 100%   BMI 28.06  kg/m   Physical Exam  Constitutional: He is oriented to person, place, and time. He appears well-developed and well-nourished. No distress.  HENT:  Head: Normocephalic and atraumatic.  Eyes: Conjunctivae are normal. No scleral icterus.  Neck: Normal range of motion. Neck supple.  Cardiovascular: Normal rate, regular rhythm and normal heart sounds.  Pulmonary/Chest: Effort normal and breath sounds normal. No respiratory distress.  Abdominal: Soft. There is no tenderness.  Musculoskeletal: He exhibits no edema.  Neurological: He is alert and oriented to person, place, and time.  Skin: Skin is warm and dry. He is not diaphoretic.  Psychiatric: His behavior is normal.  Nursing note and vitals reviewed.    ED Treatments / Results  Labs (all labs ordered are listed, but only abnormal results are  displayed) Labs Reviewed - No data to display  EKG None  Radiology No results found.  Procedures Procedures (including critical care time)  Medications Ordered in ED Medications - No data to display   Initial Impression / Assessment and Plan / ED Course  I have reviewed the triage vital signs and the nursing notes.  Pertinent labs & imaging results that were available during my care of the patient were reviewed by me and considered in my medical decision making (see chart for details).     Patient with vague, nonspecific symptoms of generalized malaise and fatigue and decreased appetite.  He has no rashes, fevers.  He has a normal heart rate.  His blood work is without any acute abnormalities.  The patient did request a TSH however I do not feel he needs to wait for this send out lab at [facility] at this time given that we would not treat it here and that he very obviously is not in myxedema coma or thyroid storm.  Explained this to the patient who will check the results on my chart and follow-up with his primary care physician.  Patient appears appropriate for discharge and continued outpatient work-up with his PCP next week.  Final Clinical Impressions(s) / ED Diagnoses   Final diagnoses:  Fatigue, unspecified type    ED Discharge Orders    None       Arthor CaptainHarris, Trevian Hayashida, PA-C 05/13/18 1712    Mesner, Barbara CowerJason, MD 05/14/18 [id number]

## 2018-05-13 NOTE — Discharge Instructions (Addendum)
Get help right away if: °You feel confused. °Your vision is blurry. °You feel faint or pass out. °You have a severe headache. °You have severe abdominal, pelvic, or back pain. °You have chest pain, shortness of breath, or an irregular or fast heartbeat. °You are unable to urinate or you urinate less than normal. °You develop abnormal bleeding, such as bleeding from the rectum, vagina, nose, lungs, or nipples. °You vomit blood. °You have thoughts about harming yourself or committing suicide. °You are worried that you might harm someone else. °

## 2018-05-13 NOTE — ED Notes (Signed)
Pt verbalizes understanding of d/c instructions and denies any further needs at this time. 

## 2018-05-18 DIAGNOSIS — R1013 Epigastric pain: Secondary | ICD-10-CM | POA: Diagnosis not present

## 2018-05-18 DIAGNOSIS — E291 Testicular hypofunction: Secondary | ICD-10-CM | POA: Diagnosis not present

## 2018-05-25 DIAGNOSIS — E291 Testicular hypofunction: Secondary | ICD-10-CM | POA: Diagnosis not present

## 2018-06-01 DIAGNOSIS — R7989 Other specified abnormal findings of blood chemistry: Secondary | ICD-10-CM | POA: Diagnosis not present

## 2018-06-08 DIAGNOSIS — E291 Testicular hypofunction: Secondary | ICD-10-CM | POA: Diagnosis not present

## 2018-06-15 DIAGNOSIS — E291 Testicular hypofunction: Secondary | ICD-10-CM | POA: Diagnosis not present

## 2018-06-17 DIAGNOSIS — E298 Other testicular dysfunction: Secondary | ICD-10-CM | POA: Diagnosis not present

## 2018-07-06 DIAGNOSIS — R7989 Other specified abnormal findings of blood chemistry: Secondary | ICD-10-CM | POA: Diagnosis not present

## 2018-07-13 DIAGNOSIS — E291 Testicular hypofunction: Secondary | ICD-10-CM | POA: Diagnosis not present

## 2019-01-18 DIAGNOSIS — R05 Cough: Secondary | ICD-10-CM | POA: Diagnosis not present

## 2019-04-03 DIAGNOSIS — R21 Rash and other nonspecific skin eruption: Secondary | ICD-10-CM | POA: Diagnosis not present

## 2019-04-28 DIAGNOSIS — A084 Viral intestinal infection, unspecified: Secondary | ICD-10-CM | POA: Diagnosis not present

## 2019-10-26 DIAGNOSIS — Z0001 Encounter for general adult medical examination with abnormal findings: Secondary | ICD-10-CM | POA: Diagnosis not present

## 2019-10-26 DIAGNOSIS — L810 Postinflammatory hyperpigmentation: Secondary | ICD-10-CM | POA: Diagnosis not present

## 2019-10-30 DIAGNOSIS — Z03818 Encounter for observation for suspected exposure to other biological agents ruled out: Secondary | ICD-10-CM | POA: Diagnosis not present

## 2019-10-30 DIAGNOSIS — Z20828 Contact with and (suspected) exposure to other viral communicable diseases: Secondary | ICD-10-CM | POA: Diagnosis not present

## 2019-12-04 DIAGNOSIS — Z0000 Encounter for general adult medical examination without abnormal findings: Secondary | ICD-10-CM | POA: Diagnosis not present

## 2020-02-05 DIAGNOSIS — N390 Urinary tract infection, site not specified: Secondary | ICD-10-CM | POA: Diagnosis not present

## 2020-02-05 DIAGNOSIS — R319 Hematuria, unspecified: Secondary | ICD-10-CM | POA: Diagnosis not present

## 2020-02-05 DIAGNOSIS — N200 Calculus of kidney: Secondary | ICD-10-CM | POA: Diagnosis not present

## 2020-02-06 DIAGNOSIS — N200 Calculus of kidney: Secondary | ICD-10-CM | POA: Diagnosis not present

## 2020-02-06 DIAGNOSIS — R319 Hematuria, unspecified: Secondary | ICD-10-CM | POA: Diagnosis not present

## 2020-02-07 ENCOUNTER — Other Ambulatory Visit: Payer: Self-pay | Admitting: Family Medicine

## 2020-02-09 ENCOUNTER — Other Ambulatory Visit: Payer: Self-pay | Admitting: Family Medicine

## 2020-02-09 DIAGNOSIS — N202 Calculus of kidney with calculus of ureter: Secondary | ICD-10-CM | POA: Diagnosis not present

## 2020-02-12 ENCOUNTER — Other Ambulatory Visit: Payer: Self-pay | Admitting: Family Medicine

## 2020-02-12 DIAGNOSIS — R109 Unspecified abdominal pain: Secondary | ICD-10-CM

## 2020-02-12 DIAGNOSIS — R319 Hematuria, unspecified: Secondary | ICD-10-CM

## 2021-05-20 ENCOUNTER — Ambulatory Visit: Payer: BC Managed Care – PPO | Admitting: Allergy

## 2021-05-20 ENCOUNTER — Other Ambulatory Visit: Payer: Self-pay

## 2021-05-20 ENCOUNTER — Encounter: Payer: Self-pay | Admitting: Allergy

## 2021-05-20 VITALS — BP 118/70 | HR 70 | Temp 98.3°F | Resp 16 | Ht 69.9 in | Wt 215.0 lb

## 2021-05-20 DIAGNOSIS — H1013 Acute atopic conjunctivitis, bilateral: Secondary | ICD-10-CM | POA: Diagnosis not present

## 2021-05-20 DIAGNOSIS — J3089 Other allergic rhinitis: Secondary | ICD-10-CM | POA: Diagnosis not present

## 2021-05-20 DIAGNOSIS — J329 Chronic sinusitis, unspecified: Secondary | ICD-10-CM | POA: Diagnosis not present

## 2021-05-20 DIAGNOSIS — R21 Rash and other nonspecific skin eruption: Secondary | ICD-10-CM | POA: Insufficient documentation

## 2021-05-20 DIAGNOSIS — T50995D Adverse effect of other drugs, medicaments and biological substances, subsequent encounter: Secondary | ICD-10-CM

## 2021-05-20 MED ORDER — TRIAMCINOLONE ACETONIDE 0.1 % EX OINT: 1.0000 "application " | TOPICAL_OINTMENT | Freq: Two times a day (BID) | CUTANEOUS | 2 refills | Status: DC | PRN

## 2021-05-20 MED ORDER — FLUTICASONE PROPIONATE 50 MCG/ACT NA SUSP: 1.0000 | Freq: Two times a day (BID) | NASAL | 5 refills | Status: DC | PRN

## 2021-05-20 MED ORDER — AZELASTINE HCL 0.1 % NA SOLN: 1.0000 | Freq: Two times a day (BID) | NASAL | 5 refills | Status: DC | PRN

## 2021-05-20 NOTE — Patient Instructions (Addendum)
Today's skin testing showed: Positive to grass, mold, dust mites, cat. Negative to common foods.  Results given.   Environmental allergies Start environmental control measures as below. Use over the counter antihistamines such as Zyrtec (cetirizine), Claritin (loratadine), Allegra (fexofenadine), or Xyzal (levocetirizine) daily as needed. May take twice a day during allergy flares. May switch antihistamines every few months. Use Flonase (fluticasone) nasal spray 1 spray per nostril twice a day as needed for nasal congestion.  Use azelastine nasal spray 1-2 sprays per nostril twice a day as needed for runny nose/drainage. Nasal saline spray (i.e., Simply Saline) or nasal saline lavage (i.e., NeilMed) is recommended as needed and prior to medicated nasal sprays. Consider allergy injections for long term control if above medications do not help the symptoms - handout given.   Rash: Possibly eczema. See below for proper skin care. Use triamcinolone 0.1% ointment twice a day as needed for rash flares. Do not use on the face, neck, armpits or groin area. Do not use more than 3 weeks in a row.   Sinus infections: Keep track of infections/antibiotics use.  Amoxicillin: Continue to avoid. Diarrhea is most likely a side effect rather than a true allergy.  Consider amoxicillin skin testing and in office drug challenge in the future.  You must be off antihistamines for 3-5 days before. Plan on being in the office for 2-3 hours and must bring in the medication we are doing the oral challenge for. You must call to schedule an appointment and specify it's for a drug challenge.    Follow up in 8 months or sooner if needed.   Reducing Pollen Exposure Pollen seasons: trees (spring), grass (summer) and ragweed/weeds (fall). Keep windows closed in your home and car to lower pollen exposure.  Install air conditioning in the bedroom and throughout the house if possible.  Avoid going out in dry windy days  - especially early morning. Pollen counts are highest between 5 - 10 AM and on dry, hot and windy days.  Save outside activities for late afternoon or after a heavy rain, when pollen levels are lower.  Avoid mowing of grass if you have grass pollen allergy. Be aware that pollen can also be transported indoors on people and pets.  Dry your clothes in an automatic dryer rather than hanging them outside where they might collect pollen.  Rinse hair and eyes before bedtime. Mold Control Mold and fungi can grow on a variety of surfaces provided certain temperature and moisture conditions exist.  Outdoor molds grow on plants, decaying vegetation and soil. The major outdoor mold, Alternaria and Cladosporium, are found in very high numbers during hot and dry conditions. Generally, a late summer - fall peak is seen for common outdoor fungal spores. Rain will temporarily lower outdoor mold spore count, but counts rise rapidly when the rainy period ends. The most important indoor molds are Aspergillus and Penicillium. Dark, humid and poorly ventilated basements are ideal sites for mold growth. The next most common sites of mold growth are the bathroom and the kitchen. Outdoor (Seasonal) Mold Control Use air conditioning and keep windows closed. Avoid exposure to decaying vegetation. Avoid leaf raking. Avoid grain handling. Consider wearing a face mask if working in moldy areas.  Indoor (Perennial) Mold Control  Maintain humidity below 50%. Get rid of mold growth on hard surfaces with water, detergent and, if necessary, 5% bleach (do not mix with other cleaners). Then dry the area completely. If mold covers an area more than 10  square feet, consider hiring an Gaffer. For clothing, washing with soap and water is best. If moldy items cannot be cleaned and dried, throw them away. Remove sources e.g. contaminated carpets. Repair and seal leaking roofs or pipes. Using dehumidifiers in  damp basements may be helpful, but empty the water and clean units regularly to prevent mildew from forming. All rooms, especially basements, bathrooms and kitchens, require ventilation and cleaning to deter mold and mildew growth. Avoid carpeting on concrete or damp floors, and storing items in damp areas. Control of House Dust Mite Allergen Dust mite allergens are a common trigger of allergy and asthma symptoms. While they can be found throughout the house, these microscopic creatures thrive in warm, humid environments such as bedding, upholstered furniture and carpeting. Because so much time is spent in the bedroom, it is essential to reduce mite levels there.  Encase pillows, mattresses, and box springs in special allergen-proof fabric covers or airtight, zippered plastic covers.  Bedding should be washed weekly in hot water (130 F) and dried in a hot dryer. Allergen-proof covers are available for comforters and pillows that can't be regularly washed.  Wash the allergy-proof covers every few months. Minimize clutter in the bedroom. Keep pets out of the bedroom.  Keep humidity less than 50% by using a dehumidifier or air conditioning. You can buy a humidity measuring device called a hygrometer to monitor this.  If possible, replace carpets with hardwood, linoleum, or washable area rugs. If that's not possible, vacuum frequently with a vacuum that has a HEPA filter. Remove all upholstered furniture and non-washable window drapes from the bedroom. Remove all non-washable stuffed toys from the bedroom.  Wash stuffed toys weekly. Pet Allergen Avoidance: Contrary to popular opinion, there are no "hypoallergenic" breeds of dogs or cats. That is because people are not allergic to an animal's hair, but to an allergen found in the animal's saliva, dander (dead skin flakes) or urine. Pet allergy symptoms typically occur within minutes. For some people, symptoms can build up and become most severe 8 to 12 hours  after contact with the animal. People with severe allergies can experience reactions in public places if dander has been transported on the pet owners' clothing. Keeping an animal outdoors is only a partial solution, since homes with pets in the yard still have higher concentrations of animal allergens. Before getting a pet, ask your allergist to determine if you are allergic to animals. If your pet is already considered part of your family, try to minimize contact and keep the pet out of the bedroom and other rooms where you spend a great deal of time. As with dust mites, vacuum carpets often or replace carpet with a hardwood floor, tile or linoleum. High-efficiency particulate air (HEPA) cleaners can reduce allergen levels over time. While dander and saliva are the source of cat and dog allergens, urine is the source of allergens from rabbits, hamsters, mice and [place] pigs; so ask a non-allergic family member to clean the animal's cage. If you have a pet allergy, talk to your allergist about the potential for allergy immunotherapy (allergy shots). This strategy can often provide long-term relief.   Skin care recommendations  Bath time: Always use lukewarm water. AVOID very hot or cold water. Keep bathing time to 5-10 minutes. Do NOT use bubble bath. Use a mild soap and use just enough to wash the dirty areas. Do NOT scrub skin vigorously.  After bathing, pat dry your skin with a towel. Do  NOT rub or scrub the skin.  Moisturizers and prescriptions:  ALWAYS apply moisturizers immediately after bathing (within 3 minutes). This helps to lock-in moisture. Use the moisturizer several times a day over the whole body. Good summer moisturizers include: Aveeno, CeraVe, Cetaphil. Good winter moisturizers include: Aquaphor, Vaseline, Cerave, Cetaphil, Eucerin, Vanicream. When using moisturizers along with medications, the moisturizer should be applied about one hour after applying the medication to  prevent diluting effect of the medication or moisturize around where you applied the medications. When not using medications, the moisturizer can be continued twice daily as maintenance.  Laundry and clothing: Avoid laundry products with added color or perfumes. Use unscented hypo-allergenic laundry products such as Tide free, Cheer free & gentle, and All free and clear.  If the skin still seems dry or sensitive, you can try double-rinsing the clothes. Avoid tight or scratchy clothing such as wool. Do not use fabric softeners or dyer sheets.

## 2021-05-20 NOTE — Assessment & Plan Note (Signed)
   Keep track of infections/antibiotics use.

## 2021-05-20 NOTE — Assessment & Plan Note (Signed)
.   See assessment and plan as above. 

## 2021-05-20 NOTE — Assessment & Plan Note (Signed)
Diarrhea as a child leading to dehydration. Denies any other associated symptoms.  Continue to avoid.  Diarrhea is most likely a side effect rather than an IgE mediated allergic drug reaction.   Consider amoxicillin skin testing and in office drug challenge in the future as penicillin type of antibiotics are frequently used for sinusitis.   You must be off antihistamines for 3-5 days before. Plan on being in the office for 2-3 hours and must bring in the medication we are doing the oral challenge for. You must call to schedule an appointment and specify it's for a drug challenge.

## 2021-05-20 NOTE — Assessment & Plan Note (Addendum)
Perennial rhino conjunctivitis symptoms for 8 years with worsening in the spring.  Tried Zyrtec, Sudafed, Flonase with some benefit.  Usually gets multiple sinus infections per year requiring antibiotics.  No prior allergy/ENT evaluation.  Today's skin testing showed: Positive to grass, mold, dust mites, cat.  Negative to common foods.   This does not explain his spring time symptoms as tree pollen was negative on testing today.   Start environmental control measures as below.  Use over the counter antihistamines such as Zyrtec (cetirizine), Claritin (loratadine), Allegra (fexofenadine), or Xyzal (levocetirizine) daily as needed. May take twice a day during allergy flares. May switch antihistamines every few months.  Use Flonase (fluticasone) nasal spray 1 spray per nostril twice a day as needed for nasal congestion.   Use azelastine nasal spray 1-2 sprays per nostril twice a day as needed for runny nose/drainage.  Nasal saline spray (i.e., Simply Saline) or nasal saline lavage (i.e., NeilMed) is recommended as needed and prior to medicated nasal sprays.  Consider allergy injections for long term control if above medications do not help the symptoms - handout given.

## 2021-05-20 NOTE — Assessment & Plan Note (Signed)
Rash on buttocks and legs at times. No triggers noted.  Marland Kitchen Possibly eczema. . See below for proper skin care. . Use triamcinolone 0.1% ointment twice a day as needed for rash flares. Do not use on the face, neck, armpits or groin area. Do not use more than 3 weeks in a row.

## 2021-05-20 NOTE — Progress Notes (Signed)
New Patient Note  RE: Antonio Black MRN: [id number] DOB: [birth year] Date of Office Visit: 05/20/2021  Consult requested by: No ref. provider found Primary care provider: Dois Davenport, MD  Chief Complaint: Allergies (Sinus infection every spring, /)  History of Present Illness: I had the pleasure of seeing Antonio Black for initial evaluation at the [facility] on 05/20/2021. He is a 35 y.o. male, who is self-referred for the evaluation of allergic rhinitis.  He reports symptoms of nasal congestion, sinus pressure, sneezing, rhinorrhea, PND, watery eyes. Symptoms have been going on for 8 years. The symptoms are present all year around with worsening in spring. Other triggers include exposure to none. Anosmia: no. Headache: yes. He has used zyrtec, sudafed, ibuprofen, Flonase with some improvement in symptoms. Sinus infections: usually gets 4-5 per year and requires antibiotics. Previous work up includes: no. Previous ENT evaluation: no. Previous sinus imaging: no. History of nasal polyps: no. Last eye exam: 1 year ago. History of reflux: takes Pepcid daily prn.  Assessment and Plan: Antonio Black is a 35 y.o. male with: Other allergic rhinitis Perennial rhino conjunctivitis symptoms for 8 years with worsening in the spring.  Tried Zyrtec, Sudafed, Flonase with some benefit.  Usually gets multiple sinus infections per year requiring antibiotics.  No prior allergy/ENT evaluation. Today's skin testing showed: Positive to grass, mold, dust mites, cat.  Negative to common foods.  This does not explain his spring time symptoms as tree pollen was negative on testing today.  Start environmental control measures as below. Use over the counter antihistamines such as Zyrtec (cetirizine), Claritin (loratadine), Allegra (fexofenadine), or Xyzal (levocetirizine) daily as needed. May take twice a day during allergy flares. May switch antihistamines every few months. Use Flonase  (fluticasone) nasal spray 1 spray per nostril twice a day as needed for nasal congestion.  Use azelastine nasal spray 1-2 sprays per nostril twice a day as needed for runny nose/drainage. Nasal saline spray (i.e., Simply Saline) or nasal saline lavage (i.e., NeilMed) is recommended as needed and prior to medicated nasal sprays. Consider allergy injections for long term control if above medications do not help the symptoms - handout given.   Allergic conjunctivitis of both eyes See assessment and plan as above.  Rash and other nonspecific skin eruption Rash on buttocks and legs at times. No triggers noted.  Possibly eczema. See below for proper skin care. Use triamcinolone 0.1% ointment twice a day as needed for rash flares. Do not use on the face, neck, armpits or groin area. Do not use more than 3 weeks in a row.   Frequent episodes of sinusitis Keep track of infections/antibiotics use.  Adverse effect of other drugs, medicaments and biological substances, subsequent encounter Diarrhea as a child leading to dehydration. Denies any other associated symptoms. Continue to avoid. Diarrhea is most likely a side effect rather than an IgE mediated allergic drug reaction.  Consider amoxicillin skin testing and in office drug challenge in the future as penicillin type of antibiotics are frequently used for sinusitis.  You must be off antihistamines for 3-5 days before. Plan on being in the office for 2-3 hours and must bring in the medication we are doing the oral challenge for. You must call to schedule an appointment and specify it's for a drug challenge.    Return in about 8 months (around 01/18/2022).  Meds ordered this encounter  Medications   triamcinolone ointment (KENALOG) 0.1 %    Sig: Apply 1  application topically 2 (two) times daily as needed (rash flare). Do not use on the face, neck, armpits or groin area. Do not use more than 3 weeks in a row.    Dispense:  30 g    Refill:  2    azelastine (ASTELIN) 0.1 % nasal spray    Sig: Place 1-2 sprays into both nostrils 2 (two) times daily as needed (nasal drainage). Use in each nostril as directed    Dispense:  30 mL    Refill:  5   fluticasone (FLONASE) 50 MCG/ACT nasal spray    Sig: Place 1 spray into both nostrils 2 (two) times daily as needed (nasal congestion).    Dispense:  16 g    Refill:  5    Lab Orders  No laboratory test(s) ordered today    Other allergy screening: Asthma: no Food allergy: no Medication allergy:  Amoxicillin - diarrhea as a child  Hymenoptera allergy: no Urticaria: no Eczema:yes Patient usually has eczema on legs and buttocks. Developed rash on buttocks this Sunday - describes it as itchy, red, raised. Rash waxes and wanes. Using topical hydrocortisone cream with no benefit.   History of recurrent infections suggestive of immunodeficency: no  Diagnostics: Skin Testing: Environmental allergy panel and select foods. Positive to grass, mold, dust mites, cat. Negative to common foods.  Results discussed with patient/family.  Airborne Adult Perc - 05/20/21 1447     Time Antigen Placed 1447    Allergen Manufacturer Waynette Buttery    Location Back    Number of Test 59    1. Control-Buffer 50% Glycerol Negative    2. Control-Histamine 1 mg/ml 2+    3. Albumin saline Negative    4. Bahia Negative    5. [place] 2+    6. Johnson Negative    7. Kentucky Blue Negative    8. Meadow Fescue Negative    9. Perennial Rye Negative    10. Sweet Vernal Negative    11. Timothy Negative    12. Cocklebur Negative    13. Burweed Marshelder Negative    14. Ragweed, short Negative    15. Ragweed, Giant Negative    16. Plantain,  English Negative    17. Lamb's Quarters Negative    18. Sheep Sorrell Negative    19. Rough Pigweed Negative    20. Marsh Elder, Rough Negative    21. Mugwort, Common Negative    22. Ash mix Negative    23. Birch mix Negative    24. Beech American Negative    25. Box,  Elder Negative    26. Cedar, red Negative    27. Cottonwood, Guinea-Bissau Negative    28. Elm mix Negative    29. Hickory Negative    30. Maple mix Negative    31. Oak, Guinea-Bissau mix Negative    32. Pecan Pollen Negative    33. Pine mix Negative    34. Sycamore Eastern Negative    35. Walnut, Black Pollen Negative    36. Alternaria alternata Negative    37. Cladosporium Herbarum Negative    38. Aspergillus mix Negative    39. Penicillium mix Negative    40. Bipolaris sorokiniana (Helminthosporium) Negative    41. Drechslera spicifera (Curvularia) Negative    42. Mucor plumbeus Negative    43. Fusarium moniliforme Negative    44. Aureobasidium pullulans (pullulara) Negative    45. Rhizopus oryzae Negative    46. Botrytis cinera Negative    47. Epicoccum nigrum  Negative    48. Phoma betae Negative    49. Candida Albicans 2+    50. Trichophyton mentagrophytes 2+    51. Mite, D Farinae  5,000 AU/ml 3+    52. Mite, D Pteronyssinus  5,000 AU/ml 3+    53. Cat Hair 10,000 BAU/ml 2+    54.  Dog Epithelia Negative    55. Mixed Feathers Negative    56. Horse Epithelia Negative    57. Cockroach, German Negative    58. Mouse Negative    59. Tobacco Leaf Negative             Food Perc - 05/20/21 1447       Test Information   Time Antigen Placed 1447    Allergen Manufacturer Waynette ButteryGreer    Location Back    Number of allergen test 10      Food   1. Peanut Negative    2. Soybean food Negative    3. Wheat, whole Negative    4. Sesame Negative    5. Milk, cow Negative    6. Egg White, chicken Negative    7. Casein Negative    8. Shellfish mix Negative    9. Fish mix Negative    10. Cashew Negative             Intradermal - 05/20/21 1527     Time Antigen Placed 1527    Allergen Manufacturer Other    Location Arm    Number of Test 12    Control Negative    Johnson Negative    Ragweed mix Negative    Weed mix Negative    Tree mix Negative    Mold 1 Negative    Mold 2  Negative    Mold 3 Negative    Mold 4 2+    Dog Negative    Cockroach Negative             Past Medical History: Patient Active Problem List   Diagnosis Date Noted   Other allergic rhinitis 05/20/2021   Allergic conjunctivitis of both eyes 05/20/2021   Rash and other nonspecific skin eruption 05/20/2021   Adverse effect of other drugs, medicaments and biological substances, subsequent encounter 05/20/2021   Frequent episodes of sinusitis 05/20/2021   Unspecified sinusitis (chronic) 12/29/2012   Past Medical History:  Diagnosis Date   Eczema    Kidney stones    Past Surgical History: Past Surgical History:  Procedure Laterality Date   ANKLE SURGERY     Medication List:  Current Outpatient Medications  Medication Sig Dispense Refill   azelastine (ASTELIN) 0.1 % nasal spray Place 1-2 sprays into both nostrils 2 (two) times daily as needed (nasal drainage). Use in each nostril as directed 30 mL 5   fluticasone (FLONASE) 50 MCG/ACT nasal spray Place 1 spray into both nostrils 2 (two) times daily as needed (nasal congestion). 16 g 5   ibuprofen (ADVIL) 600 MG tablet Take 600 mg by mouth every 6 (six) hours as needed.     triamcinolone ointment (KENALOG) 0.1 % Apply 1 application topically 2 (two) times daily as needed (rash flare). Do not use on the face, neck, armpits or groin area. Do not use more than 3 weeks in a row. 30 g 2   No current facility-administered medications for this visit.   Allergies: Allergies  Allergen Reactions   Amoxicillin Diarrhea   Social History: Social History   Socioeconomic History   Marital status: Married  Spouse name: Not on file   Number of children: Not on file   Years of education: Not on file   Highest education level: Not on file  Occupational History   Not on file  Tobacco Use   Smoking status: Former   Smokeless tobacco: Never  Vaping Use   Vaping Use: Never used  Substance and Sexual Activity   Alcohol use: No    Drug use: No   Sexual activity: Not on file  Other Topics Concern   Not on file  Social History Narrative   Not on file   Social Determinants of Health   Financial Resource Strain: Not on file  Food Insecurity: Not on file  Transportation Needs: Not on file  Physical Activity: Not on file  Stress: Not on file  Social Connections: Not on file   Lives in a house which is 36 years old. Smoking: quit in 2015, smoked for 14 years. Occupation: Counselling psychologist HistorySurveyor, minerals in the house: no Engineer, civil (consulting) in the family room: no Carpet in the bedroom: yes Heating: gas Cooling: central Pet: yes 4 dogs x 14 yrs, 13 yrs, 10 yrs, 9 yrs  Family History: Family History  Problem Relation Age of Onset   Allergic rhinitis Mother    Hypertension Mother    Alcohol abuse Father    Eczema Paternal Aunt    Allergic rhinitis Paternal Aunt    Asthma Neg Hx    Urticaria Neg Hx     Review of Systems  Constitutional:  Negative for appetite change, chills, fever and unexpected weight change.  HENT:  Negative for congestion and rhinorrhea.   Eyes:  Negative for itching.  Respiratory:  Negative for cough, chest tightness, shortness of breath and wheezing.   Cardiovascular:  Negative for chest pain.  Gastrointestinal:  Negative for abdominal pain.  Genitourinary:  Negative for difficulty urinating.  Skin:  Positive for rash.  Allergic/Immunologic: Positive for environmental allergies. Negative for food allergies.  Neurological:  Negative for headaches.   Objective: BP 118/70 (BP Location: Right Arm, Patient Position: Sitting, Cuff Size: Normal)   Pulse 70   Temp 98.3 F (36.8 C) (Temporal)   Resp 16   Ht 5' 9.9" (1.775 m)   Wt 215 lb (97.5 kg)   SpO2 98%   BMI 30.94 kg/m  Body mass index is 30.94 kg/m. Physical Exam Vitals and nursing note reviewed. Exam conducted with a chaperone present.  Constitutional:      Appearance: Normal appearance. He is  well-developed.  HENT:     Head: Normocephalic and atraumatic.     Right Ear: Tympanic membrane and external ear normal.     Left Ear: Tympanic membrane and external ear normal.     Nose: Nose normal.     Mouth/Throat:     Mouth: Mucous membranes are moist.     Pharynx: Oropharynx is clear.  Eyes:     Conjunctiva/sclera: Conjunctivae normal.  Cardiovascular:     Rate and Rhythm: Normal rate and regular rhythm.     Heart sounds: Normal heart sounds. No murmur heard.   No friction rub. No gallop.  Pulmonary:     Effort: Pulmonary effort is normal.     Breath sounds: Normal breath sounds. No wheezing, rhonchi or rales.  Musculoskeletal:     Cervical back: Neck supple.  Skin:    General: Skin is warm and dry.     Findings: Rash present.     Comments: Dry skin  on the buttocks, one erythematous, dry, scaly patch on the right buttocks area.  Neurological:     Mental Status: He is alert and oriented to person, place, and time.  Psychiatric:        Behavior: Behavior normal.  The plan was reviewed with the patient/family, and all questions/concerned were addressed.  It was my pleasure to see Shaydon today and participate in his care. Please feel free to contact me with any questions or concerns.  Sincerely,  Wyline Mood, DO Allergy & Immunology  [facility] office: [phone] [facility] office: [phone]

## 2021-08-29 ENCOUNTER — Encounter: Payer: Self-pay | Admitting: Cardiovascular Disease

## 2021-08-29 ENCOUNTER — Ambulatory Visit: Payer: BC Managed Care – PPO | Admitting: Cardiovascular Disease

## 2021-08-29 ENCOUNTER — Other Ambulatory Visit: Payer: Self-pay

## 2021-08-29 VITALS — BP 105/72 | HR 62 | Ht 69.0 in | Wt 215.4 lb

## 2021-08-29 DIAGNOSIS — R000 Tachycardia, unspecified: Secondary | ICD-10-CM

## 2021-08-29 DIAGNOSIS — E785 Hyperlipidemia, unspecified: Secondary | ICD-10-CM

## 2021-08-29 NOTE — Patient Instructions (Addendum)
Medication Instructions:  No changes *If you need a refill on your cardiac medications before your next appointment, please call your pharmacy*   Lab Work: None ordered If you have labs (blood work) drawn today and your tests are completely normal, you will receive your results only by: MyChart Message (if you have MyChart) OR A paper copy in the mail If you have any lab test that is abnormal or we need to change your treatment, we will call you to review the results.   Testing/Procedures: Your physician has requested that you have an echocardiogram. Echocardiography is a painless test that uses sound waves to create images of your heart. It provides your doctor with information about the size and shape of your heart and how well your heart's chambers and valves are working. You may receive an ultrasound enhancing agent through an IV if needed to better visualize your heart during the echo.This procedure takes approximately one hour. There are no restrictions for this procedure. This will take place at the [address], [address].   [employer] Cardiac Event Monitor Instructions Your physician has requested you wear your cardiac event monitor for __30___ days, (1-30). [employer] may call or text to confirm a shipping address. The monitor will be sent to a land address via UPS. [employer] will not ship a monitor to a PO BOX. It typically takes 3-5 days to receive your monitor after it has been enrolled. [employer] will assist with USPS tracking if your package is delayed. The telephone number for [employer] is [phone]. Once you have received your monitor, please review the enclosed instructions. Instruction tutorials can also be viewed under help and settings on the enclosed cell phone. Your monitor has already been registered assigning a specific monitor serial # to you.  Applying the monitor Remove cell phone from case and turn it on. The cell phone works as IT consultant  and needs to be within [employer] of you at all times. The cell phone will need to be charged on a daily basis. We recommend you plug the cell phone into the enclosed charger at your bedside table every night.  Monitor batteries: You will receive two monitor batteries labelled #1 and #2. These are your recorders. Plug battery #2 onto the second connection on the enclosed charger. Keep one battery on the charger at all times. This will keep the monitor battery deactivated. It will also keep it fully charged for when you need to switch your monitor batteries. A small light will be blinking on the battery emblem when it is charging. The light on the battery emblem will remain on when the battery is fully charged.  Open package of a Monitor strip. Insert battery #1 into black hood on strip and gently squeeze monitor battery onto connection as indicated in instruction booklet. Set aside while preparing skin.  Choose location for your strip, vertical or horizontal, as indicated in the instruction booklet. Shave to remove all hair from location. There cannot be any lotions, oils, powders, or colognes on skin where monitor is to be applied. Wipe skin clean with enclosed Saline wipe. Dry skin completely.  Peel paper labeled #1 off the back of the Monitor strip exposing the adhesive. Place the monitor on the chest in the vertical or horizontal position shown in the instruction booklet. One arrow on the monitor strip must be pointing upward. Carefully remove paper labeled #2, attaching remainder of strip to your skin. Try not to create any folds or wrinkles in  the strip as you apply it.  Firmly press and release the circle in the center of the monitor battery. You will hear a small beep. This is turning the monitor battery on. The heart emblem on the monitor battery will light up every 5 seconds if the monitor battery in turned on and connected to the patient securely. Do not push and hold the circle down  as this turns the monitor battery off. The cell phone will locate the monitor battery. A screen will appear on the cell phone checking the connection of your monitor strip. This may read poor connection initially but change to good connection within the next minute. Once your monitor accepts the connection you will hear a series of 3 beeps followed by a climbing crescendo of beeps. A screen will appear on the cell phone showing the two monitor strip placement options. Touch the picture that demonstrates where you applied the monitor strip.  Your monitor strip and battery are waterproof. You are able to shower, bathe, or swim with the monitor on. They just ask you do not submerge deeper than 3 feet underwater. We recommend removing the monitor if you are swimming in a lake, river, or ocean.  Your monitor battery will need to be switched to a fully charged monitor battery approximately once a week. The cell phone will alert you of an action which needs to be made.  On the cell phone, tap for details to reveal connection status, monitor battery status, and cell phone battery status. The green dots indicates your monitor is in good status. A red dot indicates there is something that needs your attention.  To record a symptom, click the circle on the monitor battery. In 30-60 seconds a list of symptoms will appear on the cell phone. Select your symptom and tap save. Your monitor will record a sustained or significant arrhythmia regardless of you clicking the button. Some patients do not feel the heart rhythm irregularities. [employer] will notify us of any serious or critical events.  Refer to instruction booklet for instructions on switching batteries, changing strips, the Do not disturb or Pause features, or any additional questions.  Call [employer] at [phone], to confirm your monitor is transmitting and record your baseline. They will answer any questions you may have regarding the  monitor instructions at that time.  Returning the monitor to [employer] Place all equipment back into blue box. Peel off strip of paper to expose adhesive and close box securely. There is a prepaid UPS shipping label on this box. Drop in a UPS drop box, or at a UPS facility like [employer]. You may also contact [employer] to arrange UPS to pick up monitor package at your home.    Follow-Up: At [facility], you and your health needs are our priority.  As part of our continuing mission to provide you with exceptional heart care, we have created designated Provider Care Teams.  These Care Teams include your primary Cardiologist (physician) and Advanced Practice Providers (APPs -  Physician Assistants and Nurse Practitioners) who all work together to provide you with the care you need, when you need it.  We recommend signing up for the patient portal called "MyChart".  Sign up information is provided on this After Visit Summary.  MyChart is used to connect with patients for Virtual Visits (Telemedicine).  Patients are able to view lab/test results, encounter notes, upcoming appointments, etc.  Non-urgent messages can be sent to your provider as well.   To learn more  about what you can do with MyChart, go to [email or web address].    Your next appointment:   12 month(s)  The format for your next appointment:   In Person  Provider:   Thurmon Fair, MD

## 2021-08-30 ENCOUNTER — Encounter: Payer: Self-pay | Admitting: Cardiovascular Disease

## 2021-08-30 DIAGNOSIS — E785 Hyperlipidemia, unspecified: Secondary | ICD-10-CM | POA: Insufficient documentation

## 2021-08-30 NOTE — Progress Notes (Signed)
Cardiology Office Note:    Date:  08/30/2021   ID:  KHAALID LEFKOWITZ, DOB Apr 12, 1986, MRN [id number]  PCP:  Dois Davenport, MD   [facility] Providers Cardiologist:  Thurmon Fair, MD Antonio Black is a 35 y.o. male who is being seen today for the evaluation of tachycardia at the request of Dois Davenport, MD.     Referring MD: Dois Davenport, MD   Chief Complaint  Patient presents with   Tachycardia         History of Present Illness:    Antonio Black is a 35 y.o. male with a hx of generally good health except for eczema, seasonal allergies and kidney stones who presents with complaints of tachycardia detected by his Fitbit.  He has not had palpitations.  The episodes have consistently occurred when he was asleep.  The longest 1 lasted for about 10 minutes and had a rate that was around 200 bpm.  He remembers the fact that he was asleep in the backseat of the truck returning from a work assignment and woke feeling exhausted.  His Fitbit does show a very abrupt increase in heart rate that a very abrupt normalization in heartbeat back to a baseline of about 70 bpm.  The device cannot show an ECG tracing.  He has had about 4 episodes in the last couple of months, none in the last 2 weeks.  He has a busy physical job and denies problems with unexpected tachycardia, palpitations, dizziness, syncope, angina or shortness of breath with activity.  He has 2 small children and this has limited his ability to exercise at home, but he still tries to do some cardio.  His Fitbit shows appropriate gradual increase in heart rate and gradual decrease in heart rate to a max of about 150-160 bpm, even when he is trying to really push it.  He denies edema, focal neurological complaints, claudication.  He does not have excessive daytime hypersomnolence and awakes feeling refreshed.  He does not think that he snores a lot.  He denies the use of recreational drugs and drinks very little  alcohol.  His paternal grandfather had multiple heart attacks and revascularization procedures.  His initial event was at age 52 or so.  He also had diabetes mellitus.  Antonio Black's father died suddenly at age 65.  Antonio Black's recent lipid profile shows a total cholesterol of 177 with an HDL of 20 and triglycerides of 236.  The calculated LDL is 100.  His fasting glucose is 78 and hemoglobin A1c was 5.1%.  Past Medical History:  Diagnosis Date   Eczema    Kidney stones     Past Surgical History:  Procedure Laterality Date   ANKLE SURGERY      Current Medications: Current Meds  Medication Sig   azelastine (ASTELIN) 0.1 % nasal spray Place 1-2 sprays into both nostrils 2 (two) times daily as needed (nasal drainage). Use in each nostril as directed   fluticasone (FLONASE) 50 MCG/ACT nasal spray Place 1 spray into both nostrils 2 (two) times daily as needed (nasal congestion).   triamcinolone ointment (KENALOG) 0.1 % Apply 1 application topically 2 (two) times daily as needed (rash flare). Do not use on the face, neck, armpits or groin area. Do not use more than 3 weeks in a row.     Allergies:   Amoxicillin and Bupropion   Social History   Socioeconomic History   Marital status: Married    Spouse name: Not  on file   Number of children: Not on file   Years of education: Not on file   Highest education level: Not on file  Occupational History   Not on file  Tobacco Use   Smoking status: Former   Smokeless tobacco: Never  Vaping Use   Vaping Use: Never used  Substance and Sexual Activity   Alcohol use: No   Drug use: No   Sexual activity: Not on file  Other Topics Concern   Not on file  Social History Narrative   Not on file   Social Determinants of Health   Financial Resource Strain: Not on file  Food Insecurity: Not on file  Transportation Needs: Not on file  Physical Activity: Not on file  Stress: Not on file  Social Connections: Not on file     Family History: The  patient's family history includes Alcohol abuse in his father; Allergic rhinitis in his mother and paternal aunt; Eczema in his paternal aunt; Hypertension in his mother. There is no history of Asthma or Urticaria.  ROS:   Please see the history of present illness.     All other systems reviewed and are negative.  EKGs/Labs/Other Studies Reviewed:    The following studies were reviewed today: Heart rate graphics from his Fitbit  EKG:  EKG is ordered today.  The ekg ordered today demonstrates normal sinus rhythm, normal tracing  Recent Labs: No results found for requested labs within last 8760 hours.  Recent Lipid Panel No results found for: CHOL, TRIG, HDL, CHOLHDL, VLDL, LDLCALC, LDLDIRECT 07/04/2021 total cholesterol 177 , HDL 20 , triglycerides 236, LDL 100, fasting glucose 78, hemoglobin A1c 5.1%. Hemoglobin 14.9, creatinine 0.78, TSH 0.82  Risk Assessment/Calculations:           Physical Exam:    VS:  BP 105/72   Pulse 62   Ht 5\' 9"  (1.753 m)   Wt 215 lb 6.4 oz (97.7 kg)   BMI 31.81 kg/m     Wt Readings from Last 3 Encounters:  08/29/21 215 lb 6.4 oz (97.7 kg)  05/20/21 215 lb (97.5 kg)  05/13/18 190 lb (86.2 kg)     GEN:  Well nourished, well developed in no acute distress.  He does appear to be mildly obese, although he is fit.  He has prominent ventral obesity. HEENT: Normal NECK: No JVD; No carotid bruits LYMPHATICS: No lymphadenopathy CARDIAC: RRR, no murmurs, rubs, gallops RESPIRATORY:  Clear to auscultation without rales, wheezing or rhonchi  ABDOMEN: Soft, non-tender, non-distended MUSCULOSKELETAL:  No edema; No deformity  SKIN: Warm and dry NEUROLOGIC:  Alert and oriented x 3 PSYCHIATRIC:  Normal affect   ASSESSMENT:    1. Tachycardia   2. Dyslipidemia (high LDL; low HDL)    PLAN:    In order of problems listed above:  Tachycardia: The abrupt onset and termination during sleep does suggest that this is probably a true arrhythmia.   Unfortunately we do not have any true corroboration with a rhythm tracing.  We will have Gerado wear a monitor and check an echocardiogram for any structural abnormalities.  Empirical treatment with an antiarrhythmic medication or beta-blocker does not appear to be justified since he is asymptomatic.  I would avoid the use of decongestants and excessive caffeine. Dyslipidemia: Daqwan has a lipid profile that is very strongly suggestive of insulin resistance/the metabolic syndrome.  He has a very low HDL and elevated triglycerides and has ventral obesity.  His grandfather had diabetes.  He is  also at risk for developing diabetes and vascular complications if he does not lose weight and exercise regularly.  We do not have a great medication to increase the HDL and his triglycerides are not high enough to justify specific therapy.  Discussed diet and exercise in detail.  Discussed the concept of the glycemic index and how to improve his nutrition.           Medication Adjustments/Labs and Tests Ordered: Current medicines are reviewed at length with the patient today.  Concerns regarding medicines are outlined above.  Orders Placed This Encounter  Procedures   Cardiac event monitor   EKG 12-Lead   ECHOCARDIOGRAM COMPLETE   No orders of the defined types were placed in this encounter.   Patient Instructions  Medication Instructions:  No changes *If you need a refill on your cardiac medications before your next appointment, please call your pharmacy*   Lab Work: None ordered If you have labs (blood work) drawn today and your tests are completely normal, you will receive your results only by: MyChart Message (if you have MyChart) OR A paper copy in the mail If you have any lab test that is abnormal or we need to change your treatment, we will call you to review the results.   Testing/Procedures: Your physician has requested that you have an echocardiogram. Echocardiography is a painless test  that uses sound waves to create images of your heart. It provides your doctor with information about the size and shape of your heart and how well your heart's chambers and valves are working. You may receive an ultrasound enhancing agent through an IV if needed to better visualize your heart during the echo.This procedure takes approximately one hour. There are no restrictions for this procedure. This will take place at the [address].   [employer] Cardiac Event Monitor Instructions Your physician has requested you wear your cardiac event monitor for __30___ days, (1-30). [employer] may call or text to confirm a shipping address. The monitor will be sent to a land address via UPS. [employer] will not ship a monitor to a PO BOX. It typically takes 3-5 days to receive your monitor after it has been enrolled. [employer] will assist with USPS tracking if your package is delayed. The telephone number for [employer] is [phone]. Once you have received your monitor, please review the enclosed instructions. Instruction tutorials can also be viewed under help and settings on the enclosed cell phone. Your monitor has already been registered assigning a specific monitor serial # to you.  Applying the monitor Remove cell phone from case and turn it on. The cell phone works as IT consultant and needs to be within [employer] of you at all times. The cell phone will need to be charged on a daily basis. We recommend you plug the cell phone into the enclosed charger at your bedside table every night.  Monitor batteries: You will receive two monitor batteries labelled #1 and #2. These are your recorders. Plug battery #2 onto the second connection on the enclosed charger. Keep one battery on the charger at all times. This will keep the monitor battery deactivated. It will also keep it fully charged for when you need to switch your monitor batteries. A small light will be blinking on the  battery emblem when it is charging. The light on the battery emblem will remain on when the battery is fully charged.  Open package of a Monitor strip. Insert battery #1 into  black hood on strip and gently squeeze monitor battery onto connection as indicated in instruction booklet. Set aside while preparing skin.  Choose location for your strip, vertical or horizontal, as indicated in the instruction booklet. Shave to remove all hair from location. There cannot be any lotions, oils, powders, or colognes on skin where monitor is to be applied. Wipe skin clean with enclosed Saline wipe. Dry skin completely.  Peel paper labeled #1 off the back of the Monitor strip exposing the adhesive. Place the monitor on the chest in the vertical or horizontal position shown in the instruction booklet. One arrow on the monitor strip must be pointing upward. Carefully remove paper labeled #2, attaching remainder of strip to your skin. Try not to create any folds or wrinkles in the strip as you apply it.  Firmly press and release the circle in the center of the monitor battery. You will hear a small beep. This is turning the monitor battery on. The heart emblem on the monitor battery will light up every 5 seconds if the monitor battery in turned on and connected to the patient securely. Do not push and hold the circle down as this turns the monitor battery off. The cell phone will locate the monitor battery. A screen will appear on the cell phone checking the connection of your monitor strip. This may read poor connection initially but change to good connection within the next minute. Once your monitor accepts the connection you will hear a series of 3 beeps followed by a climbing crescendo of beeps. A screen will appear on the cell phone showing the two monitor strip placement options. Touch the picture that demonstrates where you applied the monitor strip.  Your monitor strip and battery are waterproof. You  are able to shower, bathe, or swim with the monitor on. They just ask you do not submerge deeper than 3 feet underwater. We recommend removing the monitor if you are swimming in a lake, river, or ocean.  Your monitor battery will need to be switched to a fully charged monitor battery approximately once a week. The cell phone will alert you of an action which needs to be made.  On the cell phone, tap for details to reveal connection status, monitor battery status, and cell phone battery status. The green dots indicates your monitor is in good status. A red dot indicates there is something that needs your attention.  To record a symptom, click the circle on the monitor battery. In 30-60 seconds a list of symptoms will appear on the cell phone. Select your symptom and tap save. Your monitor will record a sustained or significant arrhythmia regardless of you clicking the button. Some patients do not feel the heart rhythm irregularities. [employer] will notify us of any serious or critical events.  Refer to instruction booklet for instructions on switching batteries, changing strips, the Do not disturb or Pause features, or any additional questions.  Call [employer] at [phone], to confirm your monitor is transmitting and record your baseline. They will answer any questions you may have regarding the monitor instructions at that time.  Returning the monitor to [employer] Place all equipment back into blue box. Peel off strip of paper to expose adhesive and close box securely. There is a prepaid UPS shipping label on this box. Drop in a UPS drop box, or at a UPS facility like [employer]. You may also contact [employer] to arrange UPS to pick up monitor package at your home.    Follow-Up:  At [facility], you and your health needs are our priority.  As part of our continuing mission to provide you with exceptional heart care, we have created designated Provider Care Teams.  These Care  Teams include your primary Cardiologist (physician) and Advanced Practice Providers (APPs -  Physician Assistants and Nurse Practitioners) who all work together to provide you with the care you need, when you need it.  We recommend signing up for the patient portal called "MyChart".  Sign up information is provided on this After Visit Summary.  MyChart is used to connect with patients for Virtual Visits (Telemedicine).  Patients are able to view lab/test results, encounter notes, upcoming appointments, etc.  Non-urgent messages can be sent to your provider as well.   To learn more about what you can do with MyChart, go to [email or web address].    Your next appointment:   12 month(s)  The format for your next appointment:   In Person  Provider:   Thurmon Fair, MD     Signed, Thurmon Fair, MD  08/30/2021 7:56 PM    [facility]

## 2021-09-09 ENCOUNTER — Encounter: Payer: Self-pay | Admitting: Cardiovascular Disease

## 2021-09-19 ENCOUNTER — Other Ambulatory Visit (HOSPITAL_COMMUNITY): Payer: BC Managed Care – PPO

## 2022-01-27 ENCOUNTER — Encounter: Payer: Self-pay | Admitting: Allergy

## 2022-01-27 ENCOUNTER — Ambulatory Visit: Payer: BC Managed Care – PPO | Admitting: Allergy

## 2022-01-27 VITALS — BP 110/70 | HR 70 | Temp 98.0°F

## 2022-01-27 DIAGNOSIS — J302 Other seasonal allergic rhinitis: Secondary | ICD-10-CM

## 2022-01-27 DIAGNOSIS — J329 Chronic sinusitis, unspecified: Secondary | ICD-10-CM | POA: Diagnosis not present

## 2022-01-27 DIAGNOSIS — H1013 Acute atopic conjunctivitis, bilateral: Secondary | ICD-10-CM

## 2022-01-27 DIAGNOSIS — H1010 Acute atopic conjunctivitis, unspecified eye: Secondary | ICD-10-CM | POA: Insufficient documentation

## 2022-01-27 DIAGNOSIS — T50995D Adverse effect of other drugs, medicaments and biological substances, subsequent encounter: Secondary | ICD-10-CM

## 2022-01-27 DIAGNOSIS — R21 Rash and other nonspecific skin eruption: Secondary | ICD-10-CM | POA: Diagnosis not present

## 2022-01-27 MED ORDER — RYALTRIS 665-25 MCG/ACT NA SUSP: 1.0000 | Freq: Two times a day (BID) | NASAL | 5 refills | Status: DC

## 2022-01-27 MED ORDER — MONTELUKAST SODIUM 10 MG PO TABS: 10.0000 mg | ORAL_TABLET | Freq: Every day | ORAL | 3 refills | Status: DC

## 2022-01-27 NOTE — Patient Instructions (Addendum)
Environmental allergies ?2022 skin testing showed: Positive to grass, mold, dust mites, cat. ?Continue environmental control measures as below. ?Take Allegra (fexofenadine) 180mg  one pill at night - samples given. ?If it makes you drowsy in the morning then try 1/2 pill at night. ?If allegra does not help as much then add on: ?Singulair (montelukast) 10mg  daily at night. ?Cautioned that in some children/adults can experience behavioral changes including hyperactivity, agitation, depression, sleep disturbances and suicidal ideations. These side effects are rare, but if you notice them you should notify me and discontinue Singulair (montelukast). ? ?Start Ryaltris (olopatadine + mometasone nasal spray combination) 1-2 sprays per nostril twice a day. Sample given. ?This will be mailed to you.  ?This replaces Flonase and azelastine.  ?Nasal saline spray (i.e., Simply Saline) or nasal saline lavage (i.e., NeilMed) is recommended as needed and prior to medicated nasal sprays. ? ?Itchy scalp: ?Let me know if not improving - can send in some topical steroid scalp lotion and/or prednisone. ? ?Sinus infections: ?Keep track of infections/antibiotics use. ? ?Follow up in 6 months or sooner if needed.  ?

## 2022-01-27 NOTE — Progress Notes (Signed)
? ?Follow Up Note ? ?RE: Antonio Black MRN: [id number] DOB: [birth year] ?Date of Office Visit: 01/27/2022 ? ?Referring provider: Dois Davenport, MD ?Primary care provider: Dois Davenport, MD ? ?Chief Complaint: Allergies (Grass is bad since January has had three sinus infections and with in the last week has been having itchy scalp/) ? ?History of Present Illness: ?I had the pleasure of seeing Antonio Black for a [facility]  on 01/27/2022. He is a 36 y.o. male, who is being followed for allergic rhinoconjunctivitis, rash, frequent sinusitis, adverse drug reactions. His previous allergy office visit was on 05/20/2021 with Dr. Selena Batten. Today is a regular follow up visit. ? ?Allergic rhino conjunctivitis ?Noted increased symptoms in February. ?The past 2 weeks - he noted PND, nasal congestion, sneezing. ?Currently not taking any daily antihistamines as it tends to make him tired. Taking benadryl prn at night.  ?Only using Flonase prn and azelastine prn. ?Not interested in AIT. ?  ?Rash/itching ?Itchy scalp for the past week. Denies changes in personal care products.  ?He thinks he may have gotten sunburnt as he was outdoors doing some yardwork.  ? ?Frequent episodes of sinusitis ?No antibiotics since the last visit but feels like he has inflammation in his sinuses.   ?  ?Assessment and Plan: ?Antonio Black is a 36 y.o. male with: ?Seasonal and perennial allergic rhinoconjunctivitis ?Past history - Perennial rhino conjunctivitis symptoms for 8 years with worsening in the spring.  Tried Zyrtec, Sudafed, Flonase with some benefit.  Usually gets multiple sinus infections per year requiring antibiotics.  No prior ENT evaluation. 2022 skin testing showed: Positive to grass, mold, dust mites, cat.   ?Interim history - increased symptoms since February. Antihistamines cause drowsiness. Not taking anything on a daily basis. Not interested in AIT. ?Continue environmental control measures as  below. ?Take Allegra (fexofenadine) 180mg  one pill at night - samples given. ?If it makes you drowsy in the morning then try 1/2 pill at night. ?If allegra does not help then add on: ?Singulair (montelukast) 10mg  daily at night. ?Cautioned that in some children/adults can experience behavioral changes including hyperactivity, agitation, depression, sleep disturbances and suicidal ideations. These side effects are rare, but if you notice them you should notify me and discontinue Singulair (montelukast). ?Start Ryaltris (olopatadine + mometasone nasal spray combination) 1-2 sprays per nostril twice a day. Sample given. ?This will be mailed to you.  ?This replaces Flonase and azelastine.  ?Nasal saline spray (i.e., Simply Saline) or nasal saline lavage (i.e., NeilMed) is recommended as needed and prior to medicated nasal sprays. ? ?Frequent episodes of sinusitis ?Interim history - no antibiotics since last OV. ?Keep track of infections/antibiotics use. ? ?Rash and other nonspecific skin eruption ?Itchy scalp. Denies changes in personal care products. ?Offered topical steroid cream and/or oral prednisone but patient declines and wants to see if it improves on its own. ?Let me know if not improving. ? ?Adverse effect of other drugs, medicaments and biological substances, subsequent encounter ?Past history - Diarrhea as a child leading to dehydration after taking amoxicillin. Denies any other associated symptoms. ?Interim history - reaction to bupropion. ?Continue to avoid above meds. ? ?Return in about 6 months (around 07/29/2022). ? ?Meds ordered this encounter  ?Medications  ? montelukast (SINGULAIR) 10 MG tablet  ?  Sig: Take 1 tablet (10 mg total) by mouth at bedtime.  ?  Dispense:  30 tablet  ?  Refill:  3  ? Olopatadine-Mometasone (  RYALTRIS) [id number] MCG/ACT SUSP  ?  Sig: Place 1-2 sprays into the nose in the morning and at bedtime.  ?  Dispense:  29 g  ?  Refill:  5  ?  [id number]  ? ?Lab Orders  ?No laboratory  test(s) ordered today  ? ?Diagnostics: ?None. ? ?Medication List:  ?Current Outpatient Medications  ?Medication Sig Dispense Refill  ? divalproex (DEPAKOTE ER) 250 MG 24 hr tablet Take by mouth.    ? montelukast (SINGULAIR) 10 MG tablet Take 1 tablet (10 mg total) by mouth at bedtime. 30 tablet 3  ? Olopatadine-Mometasone (RYALTRIS) [id number] MCG/ACT SUSP Place 1-2 sprays into the nose in the morning and at bedtime. 29 g 5  ? triamcinolone ointment (KENALOG) 0.1 % Apply 1 application topically 2 (two) times daily as needed (rash flare). Do not use on the face, neck, armpits or groin area. Do not use more than 3 weeks in a row. 30 g 2  ? ?No current facility-administered medications for this visit.  ? ?Allergies: ?Allergies  ?Allergen Reactions  ? Amoxicillin Diarrhea  ? Bupropion Cough, Hives, Itching, Nausea Only and Rash  ? ?I reviewed his past medical history, social history, family history, and environmental history and no significant changes have been reported from his previous visit. ? ?Review of Systems  ?Constitutional:  Negative for appetite change, chills, fever and unexpected weight change.  ?HENT:  Positive for congestion, postnasal drip, rhinorrhea and sneezing.   ?Eyes:  Negative for itching.  ?Respiratory:  Negative for cough, chest tightness, shortness of breath and wheezing.   ?Cardiovascular:  Negative for chest pain.  ?Gastrointestinal:  Negative for abdominal pain.  ?Genitourinary:  Negative for difficulty urinating.  ?Skin:  Positive for rash.  ?Allergic/Immunologic: Positive for environmental allergies. Negative for food allergies.  ?Neurological:  Negative for headaches.  ? ?Objective: ?BP 110/70 (BP Location: Right Arm, Patient Position: Sitting, Cuff Size: Normal)   Pulse 70   Temp 98 ?F (36.7 ?C) (Temporal)   SpO2 96%  ?There is no height or weight on file to calculate BMI. ?Physical Exam ?Vitals and nursing note reviewed.  ?Constitutional:   ?   Appearance: Normal appearance. He is  well-developed.  ?HENT:  ?   Head: Normocephalic and atraumatic.  ?   Right Ear: Tympanic membrane and external ear normal.  ?   Left Ear: Tympanic membrane and external ear normal.  ?   Nose: Nose normal.  ?   Mouth/Throat:  ?   Mouth: Mucous membranes are moist.  ?   Pharynx: Oropharynx is clear.  ?Eyes:  ?   Conjunctiva/sclera: Conjunctivae normal.  ?Cardiovascular:  ?   Rate and Rhythm: Normal rate and regular rhythm.  ?   Heart sounds: Normal heart sounds. No murmur heard. ?  No friction rub. No gallop.  ?Pulmonary:  ?   Effort: Pulmonary effort is normal.  ?   Breath sounds: Normal breath sounds. No wheezing, rhonchi or rales.  ?Musculoskeletal:  ?   Cervical back: Neck supple.  ?Skin: ?   General: Skin is warm.  ?   Findings: Rash present.  ?   Comments: Erythematous patches on the scalp with some papular rashes.   ?Neurological:  ?   Mental Status: He is alert and oriented to person, place, and time.  ?Psychiatric:     ?   Behavior: Behavior normal.  ?Previous notes and tests were reviewed. ?The plan was reviewed with the patient/family, and all questions/concerned were addressed. ? ?It was my  pleasure to see Dravyn today and participate in his care. Please feel free to contact me with any questions or concerns. ? ?Sincerely, ? ?Wyline Mood, DO ?Allergy & Immunology ? ?[facility] ?[place] office: [phone] ?[place] office: [phone] ?

## 2022-01-27 NOTE — Assessment & Plan Note (Signed)
Interim history - no antibiotics since last OV.  Keep track of infections/antibiotics use. 

## 2022-01-27 NOTE — Assessment & Plan Note (Signed)
Past history - Diarrhea as a child leading to dehydration after taking amoxicillin. Denies any other associated symptoms. ?Interim history - reaction to bupropion. ?? Continue to avoid above meds. ? ?

## 2022-01-27 NOTE — Assessment & Plan Note (Addendum)
Itchy scalp. Denies changes in personal care products. ?? Offered topical steroid cream and/or oral prednisone but patient declines and wants to see if it improves on its own. ?? Let me know if not improving. ?

## 2022-01-27 NOTE — Assessment & Plan Note (Signed)
Past history - Perennial rhino conjunctivitis symptoms for 8 years with worsening in the spring.  Tried Zyrtec, Sudafed, Flonase with some benefit.  Usually gets multiple sinus infections per year requiring antibiotics.  No prior ENT evaluation. 2022 skin testing showed: Positive to grass, mold, dust mites, cat.   ?Interim history - increased symptoms since February. Antihistamines cause drowsiness. Not taking anything on a daily basis. Not interested in AIT. ?? Continue environmental control measures as below. ?? Take Allegra (fexofenadine) 180mg  one pill at night - samples given. ?? If it makes you drowsy in the morning then try 1/2 pill at night. ?? If allegra does not help then add on: ?? Singulair (montelukast) 10mg  daily at night. ?? Cautioned that in some children/adults can experience behavioral changes including hyperactivity, agitation, depression, sleep disturbances and suicidal ideations. These side effects are rare, but if you notice them you should notify me and discontinue Singulair (montelukast). ?? Start Ryaltris (olopatadine + mometasone nasal spray combination) 1-2 sprays per nostril twice a day. Sample given. ?? This will be mailed to you.  ?? This replaces Flonase and azelastine.  ?? Nasal saline spray (i.e., Simply Saline) or nasal saline lavage (i.e., NeilMed) is recommended as needed and prior to medicated nasal sprays. ?

## 2022-01-29 ENCOUNTER — Telehealth: Payer: Self-pay | Admitting: *Deleted

## 2022-01-29 NOTE — Telephone Encounter (Signed)
PA has been submitted through CoverMyMeds for Ryaltris and is currently pending approval/denial.  

## 2022-01-29 NOTE — Telephone Encounter (Signed)
PA has been approved for Ryaltris and has been faxed to pharmacy through CoverMyMeds. PA is good for one year.  ?

## 2022-08-03 NOTE — Progress Notes (Signed)
Follow Up Note  RE: [name] MRN: [id number] DOB: [birth year] Date of Office Visit: 08/04/2022  Referring provider: Dois Davenport, MD Primary care provider: Dois Davenport, MD  Chief Complaint: Allergic Reaction (No more reactions. ), Sinusitis (Occasional pressure ), and Medication Refill (Ryaltris )  History of Present Illness: I had the pleasure of seeing Antonio Black for a follow up visit at the [facility] on 08/04/2022. He is a 36 y.o. male, who is being followed for allergic rhinoconjunctivitis, frequent sinusitis, rash, adverse drug reactions. His previous allergy office visit was on 01/27/2022 with Dr. Selena Black. Today is a regular follow up visit.  Seasonal and perennial allergic rhinoconjunctivitis Symptoms were not too bad this year. No confirmed sinus infections, occasional sinus pressure.  Patient tried allegra but caused drowsiness.   Takes Singulair about 2-3 times per week with some benefit. This does not cause drowsiness.  Tried Ryaltris which helped and only used as prn. Needs refill.    Frequent episodes of sinusitis No antibiotics.   Assessment and Plan: Antonio Black is a 36 y.o. male with: Seasonal and perennial allergic rhinoconjunctivitis Past history - Perennial rhino conjunctivitis symptoms for 8 years with worsening in the spring.  Tried Zyrtec, Sudafed, Flonase with some benefit.  Usually gets multiple sinus infections per year requiring antibiotics.  No prior ENT evaluation. 2022 skin testing showed: Positive to grass, mold, dust mites, cat.  Not interested in AIT. Interim history - allegra cause drowsiness.  Tolerated Singulair and Ryaltris.  Continue environmental control measures as below. Continue Singulair (montelukast) 10mg  daily at night. Start Ryaltris (olopatadine + mometasone nasal spray combination) 1-2 sprays per nostril twice a day. Sample given. This replaces your other nasal sprays. If this works well for you, then  have [facility] ship the medication to your home - prescription already sent in.  Nasal saline spray (i.e., Simply Saline) or nasal saline lavage (i.e., NeilMed) is recommended as needed and prior to medicated nasal sprays.  Frequent episodes of sinusitis Interim history - no antibiotics since last OV. Keep track of infections/antibiotics use.  Adverse effect of other drugs, medicaments and biological substances, subsequent encounter Past history - Diarrhea as a child leading to dehydration after taking amoxicillin. Denies any other associated symptoms. Continue to avoid above amoxicillin and bupropion.   Return in about 1 year (around 08/05/2023).  Meds ordered this encounter  Medications   Olopatadine-Mometasone (RYALTRIS) 665-25 MCG/ACT SUSP    Sig: Place 1-2 sprays into the nose in the morning and at bedtime.    Dispense:  29 g    Refill:  5    [phone]   Lab Orders  No laboratory test(s) ordered today    Diagnostics: None.   Medication List:  Current Outpatient Medications  Medication Sig Dispense Refill   divalproex (DEPAKOTE ER) 250 MG 24 hr tablet Take by mouth.     montelukast (SINGULAIR) 10 MG tablet Take 1 tablet (10 mg total) by mouth at bedtime. (Patient not taking: Reported on 08/04/2022) 30 tablet 3   Olopatadine-Mometasone (RYALTRIS) 665-25 MCG/ACT SUSP Place 1-2 sprays into the nose in the morning and at bedtime. 29 g 5   triamcinolone ointment (KENALOG) 0.1 % Apply 1 application topically 2 (two) times daily as needed (rash flare). Do not use on the face, neck, armpits or groin area. Do not use more than 3 weeks in a row. (Patient not taking: Reported on 08/04/2022) 30 g 2   No current facility-administered medications  for this visit.   Allergies: Allergies  Allergen Reactions   Amoxicillin Diarrhea   Bupropion Cough, Hives, Itching, Nausea Only and Rash   I reviewed his past medical history, social history, family history, and environmental history and  no significant changes have been reported from his previous visit.  Review of Systems  Constitutional:  Negative for appetite change, chills, fever and unexpected weight change.  HENT:  Negative for congestion, postnasal drip, rhinorrhea and sneezing.   Eyes:  Negative for itching.  Respiratory:  Negative for cough, chest tightness, shortness of breath and wheezing.   Cardiovascular:  Negative for chest pain.  Gastrointestinal:  Negative for abdominal pain.  Genitourinary:  Negative for difficulty urinating.  Skin:  Negative for rash.  Allergic/Immunologic: Positive for environmental allergies. Negative for food allergies.  Neurological:  Negative for headaches.    Objective: BP 108/70   Pulse 67   Temp 98.3 F (36.8 C)   Resp 16   Ht 5\' 11"  (1.803 m)   Wt 216 lb 12 oz (98.3 kg)   SpO2 97%   BMI 30.23 kg/m  Body mass index is 30.23 kg/m. Physical Exam Vitals and nursing note reviewed.  Constitutional:      Appearance: Normal appearance. He is well-developed.  HENT:     Head: Normocephalic and atraumatic.     Right Ear: Tympanic membrane and external ear normal.     Left Ear: Tympanic membrane and external ear normal.     Nose: Nose normal.     Mouth/Throat:     Mouth: Mucous membranes are moist.     Pharynx: Oropharynx is clear.  Eyes:     Conjunctiva/sclera: Conjunctivae normal.  Cardiovascular:     Rate and Rhythm: Normal rate and regular rhythm.     Heart sounds: Normal heart sounds. No murmur heard.    No friction rub. No gallop.  Pulmonary:     Effort: Pulmonary effort is normal.     Breath sounds: Normal breath sounds. No wheezing, rhonchi or rales.  Musculoskeletal:     Cervical back: Neck supple.  Skin:    General: Skin is warm.     Findings: No rash.  Neurological:     Mental Status: He is alert and oriented to person, place, and time.  Psychiatric:        Behavior: Behavior normal.   Previous notes and tests were reviewed. The plan was reviewed  with the patient/family, and all questions/concerned were addressed.  It was my pleasure to see Antonio Black today and participate in his care. Please feel free to contact me with any questions or concerns.  Sincerely,  Rexene Alberts, DO Allergy & Immunology  [facility] office: [place] office: [phone]

## 2022-08-04 ENCOUNTER — Encounter: Payer: Self-pay | Admitting: Allergy

## 2022-08-04 ENCOUNTER — Ambulatory Visit (INDEPENDENT_AMBULATORY_CARE_PROVIDER_SITE_OTHER): Payer: BC Managed Care – PPO | Admitting: Allergy

## 2022-08-04 VITALS — BP 108/70 | HR 67 | Temp 98.3°F | Resp 16 | Ht 71.0 in | Wt 216.8 lb

## 2022-08-04 DIAGNOSIS — T50995D Adverse effect of other drugs, medicaments and biological substances, subsequent encounter: Secondary | ICD-10-CM

## 2022-08-04 DIAGNOSIS — H1010 Acute atopic conjunctivitis, unspecified eye: Secondary | ICD-10-CM

## 2022-08-04 DIAGNOSIS — J329 Chronic sinusitis, unspecified: Secondary | ICD-10-CM

## 2022-08-04 DIAGNOSIS — R21 Rash and other nonspecific skin eruption: Secondary | ICD-10-CM

## 2022-08-04 DIAGNOSIS — H1013 Acute atopic conjunctivitis, bilateral: Secondary | ICD-10-CM

## 2022-08-04 DIAGNOSIS — Z880 Allergy status to penicillin: Secondary | ICD-10-CM | POA: Diagnosis not present

## 2022-08-04 DIAGNOSIS — J302 Other seasonal allergic rhinitis: Secondary | ICD-10-CM

## 2022-08-04 MED ORDER — RYALTRIS 665-25 MCG/ACT NA SUSP: 1.0000 | Freq: Two times a day (BID) | NASAL | 5 refills | Status: DC

## 2022-08-04 NOTE — Patient Instructions (Addendum)
Environmental allergies 2022 skin testing showed: Positive to grass, mold, dust mites, cat. Continue environmental control measures as below. Continue Singulair (montelukast) 10mg  daily at night. Start Ryaltris (olopatadine + mometasone nasal spray combination) 1-2 sprays per nostril twice a day. Sample given. This replaces your other nasal sprays. If this works well for you, then have [facility] ship the medication to your home - prescription already sent in.  Nasal saline spray (i.e., Simply Saline) or nasal saline lavage (i.e., NeilMed) is recommended as needed and prior to medicated nasal sprays.  Sinus infections: Keep track of infections/antibiotics use.  Follow up in 12 months or sooner if needed.   Reducing Pollen Exposure Pollen seasons: trees (spring), grass (summer) and ragweed/weeds (fall). Keep windows closed in your home and car to lower pollen exposure.  Install air conditioning in the bedroom and throughout the house if possible.  Avoid going out in dry windy days - especially early morning. Pollen counts are highest between 5 - 10 AM and on dry, hot and windy days.  Save outside activities for late afternoon or after a heavy rain, when pollen levels are lower.  Avoid mowing of grass if you have grass pollen allergy. Be aware that pollen can also be transported indoors on people and pets.  Dry your clothes in an automatic dryer rather than hanging them outside where they might collect pollen.  Rinse hair and eyes before bedtime. Mold Control Mold and fungi can grow on a variety of surfaces provided certain temperature and moisture conditions exist.  Outdoor molds grow on plants, decaying vegetation and soil. The major outdoor mold, Alternaria and Cladosporium, are found in very high numbers during hot and dry conditions. Generally, a late summer - fall peak is seen for common outdoor fungal spores. Rain will temporarily lower outdoor mold spore count, but counts rise rapidly  when the rainy period ends. The most important indoor molds are Aspergillus and Penicillium. Dark, humid and poorly ventilated basements are ideal sites for mold growth. The next most common sites of mold growth are the bathroom and the kitchen. Outdoor (Seasonal) Mold Control Use air conditioning and keep windows closed. Avoid exposure to decaying vegetation. Avoid leaf raking. Avoid grain handling. Consider wearing a face mask if working in moldy areas.  Indoor (Perennial) Mold Control  Maintain humidity below 50%. Get rid of mold growth on hard surfaces with water, detergent and, if necessary, 5% bleach (do not mix with other cleaners). Then dry the area completely. If mold covers an area more than 10 square feet, consider hiring an indoor environmental professional. For clothing, washing with soap and water is best. If moldy items cannot be cleaned and dried, throw them away. Remove sources e.g. contaminated carpets. Repair and seal leaking roofs or pipes. Using dehumidifiers in damp basements may be helpful, but empty the water and clean units regularly to prevent mildew from forming. All rooms, especially basements, bathrooms and kitchens, require ventilation and cleaning to deter mold and mildew growth. Avoid carpeting on concrete or damp floors, and storing items in damp areas. Control of House Dust Mite Allergen Dust mite allergens are a common trigger of allergy and asthma symptoms. While they can be found throughout the house, these microscopic creatures thrive in warm, humid environments such as bedding, upholstered furniture and carpeting. Because so much time is spent in the bedroom, it is essential to reduce mite levels there.  Encase pillows, mattresses, and box springs in special allergen-proof fabric covers or airtight, zippered plastic covers.  Bedding should be washed weekly in hot water (130 F) and dried in a hot dryer. Allergen-proof covers are available for comforters and  pillows that can't be regularly washed.  Wash the allergy-proof covers every few months. Minimize clutter in the bedroom. Keep pets out of the bedroom.  Keep humidity less than 50% by using a dehumidifier or air conditioning. You can buy a humidity measuring device called a hygrometer to monitor this.  If possible, replace carpets with hardwood, linoleum, or washable area rugs. If that's not possible, vacuum frequently with a vacuum that has a HEPA filter. Remove all upholstered furniture and non-washable window drapes from the bedroom. Remove all non-washable stuffed toys from the bedroom.  Wash stuffed toys weekly. Pet Allergen Avoidance: Contrary to popular opinion, there are no "hypoallergenic" breeds of dogs or cats. That is because people are not allergic to an animal's hair, but to an allergen found in the animal's saliva, dander (dead skin flakes) or urine. Pet allergy symptoms typically occur within minutes. For some people, symptoms can build up and become most severe 8 to 12 hours after contact with the animal. People with severe allergies can experience reactions in public places if dander has been transported on the pet owners' clothing. Keeping an animal outdoors is only a partial solution, since homes with pets in the yard still have higher concentrations of animal allergens. Before getting a pet, ask your allergist to determine if you are allergic to animals. If your pet is already considered part of your family, try to minimize contact and keep the pet out of the bedroom and other rooms where you spend a great deal of time. As with dust mites, vacuum carpets often or replace carpet with a hardwood floor, tile or linoleum. High-efficiency particulate air (HEPA) cleaners can reduce allergen levels over time. While dander and saliva are the source of cat and dog allergens, urine is the source of allergens from rabbits, hamsters, mice and [place] pigs; so ask a non-allergic family member to  clean the animal's cage. If you have a pet allergy, talk to your allergist about the potential for allergy immunotherapy (allergy shots). This strategy can often provide long-term relief.

## 2022-08-04 NOTE — Assessment & Plan Note (Signed)
Past history - Diarrhea as a child leading to dehydration after taking amoxicillin. Denies any other associated symptoms.  Continue to avoid above amoxicillin and bupropion.

## 2022-08-04 NOTE — Assessment & Plan Note (Signed)
Past history - Perennial rhino conjunctivitis symptoms for 8 years with worsening in the spring.  Tried Zyrtec, Sudafed, Flonase with some benefit.  Usually gets multiple sinus infections per year requiring antibiotics.  No prior ENT evaluation. 2022 skin testing showed: Positive to grass, mold, dust mites, cat.  Not interested in AIT. Interim history - allegra cause drowsiness.  Tolerated Singulair and Ryaltris.   Continue environmental control measures as below.  Continue Singulair (montelukast) 10mg  daily at night.  Start Ryaltris (olopatadine + mometasone nasal spray combination) 1-2 sprays per nostril twice a day. Sample given.  This replaces your other nasal sprays.  If this works well for you, then have [facility] ship the medication to your home - prescription already sent in.   Nasal saline spray (i.e., Simply Saline) or nasal saline lavage (i.e., NeilMed) is recommended as needed and prior to medicated nasal sprays.

## 2022-08-04 NOTE — Assessment & Plan Note (Signed)
Interim history - no antibiotics since last OV.  Keep track of infections/antibiotics use.

## 2023-08-02 NOTE — Progress Notes (Signed)
Follow Up Note  RE: Antonio Black MRN: [id number] DOB: 26-Apr-1986 Date of Office Visit: 08/03/2023  Referring provider: Dois Davenport, MD Primary care provider: Dois Davenport, MD  Chief Complaint: No chief complaint on file.  History of Present Illness: I had the pleasure of seeing Antonio Black for a follow up visit at the [facility] on 08/02/2023. He is a 37 y.o. male, who is being followed for allergic rhinoconjunctivitis, frequent sinusitis, adverse drug reactions. His previous allergy office visit was on 08/04/2022 with Dr. Selena Batten. Today is a regular follow up visit.  Discussed the use of AI scribe software for clinical note transcription with the patient, who gave verbal consent to proceed.  History of Present Illness            easonal and perennial allergic rhinoconjunctivitis Past history - Perennial rhino conjunctivitis symptoms for 8 years with worsening in the spring.  Tried Zyrtec, Sudafed, Flonase with some benefit.  Usually gets multiple sinus infections per year requiring antibiotics.  No prior ENT evaluation. 2022 skin testing showed: Positive to grass, mold, dust mites, cat.  Not interested in AIT. Interim history - allegra cause drowsiness.  Tolerated Singulair and Ryaltris.  Continue environmental control measures as below. Continue Singulair (montelukast) 10mg  daily at night. Start Ryaltris (olopatadine + mometasone nasal spray combination) 1-2 sprays per nostril twice a day. Sample given. This replaces your other nasal sprays. If this works well for you, then have [facility] ship the medication to your home - prescription already sent in.  Nasal saline spray (i.e., Simply Saline) or nasal saline lavage (i.e., NeilMed) is recommended as needed and prior to medicated nasal sprays.   Frequent episodes of sinusitis Interim history - no antibiotics since last OV. Keep track of infections/antibiotics use.   Adverse effect of other drugs,  medicaments and biological substances, subsequent encounter Past history - Diarrhea as a child leading to dehydration after taking amoxicillin. Denies any other associated symptoms. Continue to avoid above amoxicillin and bupropion.   Assessment and Plan: Antonio Black is a 37 y.o. 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  Tried Zyrtec, Sudafed, Flonase with some benefit.  Usually gets multiple sinus infections per year requiring antibiotics.  No prior ENT evaluation. 2022 skin testing positive to grass, mold, dust mites, cat.  Not interested in AIT. Allegra caused drowsiness. Interim history -    Frequent episodes of sinusitis ***  Adverse effect of other drugs, medicaments and biological substances, subsequent encounter Past history - Diarrhea as a child leading to dehydration after taking amoxicillin. Denies any other associated symptoms.  Assessment and Plan              No follow-ups on file.  No orders of the defined types were placed in this encounter.  Lab Orders  No laboratory test(s) ordered today    Diagnostics: Spirometry:  Tracings reviewed. His effort: {Blank single:19197::"Good reproducible efforts.","It was hard to get consistent efforts and there is a question as to whether this reflects a maximal maneuver.","Poor effort, data can not be interpreted."} FVC: ***L FEV1: ***L, ***% predicted FEV1/FVC ratio: ***% Interpretation: {Blank single:19197::"Spirometry consistent with mild obstructive disease","Spirometry consistent with moderate obstructive disease","Spirometry consistent with severe obstructive disease","Spirometry consistent with possible restrictive disease","Spirometry consistent  with mixed obstructive and  restrictive disease","Spirometry uninterpretable due to technique","Spirometry consistent with normal pattern","No overt abnormalities noted given today's efforts"}.  Please see scanned spirometry results for details.  Skin Testing: {Blank single:19197::"Select foods","Environmental allergy panel","Environmental allergy panel and select foods","Food allergy panel","None","Deferred due to recent antihistamines use"}. *** Results discussed with patient/family.   Medication List:  Current Outpatient Medications  Medication Sig Dispense Refill  . divalproex (DEPAKOTE ER) 250 MG 24 hr tablet Take by mouth.    . montelukast (SINGULAIR) 10 MG tablet Take 1 tablet (10 mg total) by mouth at bedtime. (Patient not taking: Reported on 08/04/2022) 30 tablet 3  . Olopatadine-Mometasone (RYALTRIS) X543819 MCG/ACT SUSP Place 1-2 sprays into the nose in the morning and at bedtime. 29 g 5  . triamcinolone ointment (KENALOG) 0.1 % Apply 1 application topically 2 (two) times daily as needed (rash flare). Do not use on the face, neck, armpits or groin area. Do not use more than 3 weeks in a row. (Patient not taking: Reported on 08/04/2022) 30 g 2   No current facility-administered medications for this visit.   Allergies: Allergies  Allergen Reactions  . Amoxicillin Diarrhea  . Bupropion Cough, Hives, Itching, Nausea Only and Rash   I reviewed his past medical history, social history, family history, and environmental history and no significant changes have been reported from his previous visit.  Review of Systems  Constitutional:  Negative for appetite change, chills, fever and unexpected weight change.  HENT:  Negative for congestion, postnasal drip, rhinorrhea and sneezing.   Eyes:  Negative for itching.  Respiratory:  Negative for cough, chest tightness, shortness of breath and wheezing.   Cardiovascular:  Negative for chest pain.  Gastrointestinal:  Negative for abdominal pain.  Genitourinary:   Negative for difficulty urinating.  Skin:  Negative for rash.  Allergic/Immunologic: Positive for environmental allergies. Negative for food allergies.  Neurological:  Negative for headaches.   Objective: There were no vitals taken for this visit. There is no height or weight on file to calculate BMI. Physical Exam Vitals and nursing note reviewed.  Constitutional:      Appearance: Normal appearance. He is well-developed.  HENT:     Head: Normocephalic and atraumatic.     Right Ear: Tympanic membrane and external ear normal.     Left Ear: Tympanic membrane and external ear normal.     Nose: Nose normal.     Mouth/Throat:     Mouth: Mucous membranes are moist.     Pharynx: Oropharynx is clear.  Eyes:     Conjunctiva/sclera: Conjunctivae normal.  Cardiovascular:     Rate and Rhythm: Normal rate and regular rhythm.     Heart sounds: Normal heart sounds. No murmur heard.    No friction rub. No gallop.  Pulmonary:     Effort: Pulmonary effort is normal.     Breath sounds: Normal breath sounds. No wheezing, rhonchi or rales.  Musculoskeletal:     Cervical back: Neck supple.  Skin:    General: Skin is warm.     Findings: No rash.  Neurological:     Mental Status: He is alert and oriented to person, place, and time.  Psychiatric:        Behavior: Behavior normal.  Previous notes and tests were reviewed. The plan was reviewed with the patient/family, and all questions/concerned were addressed.  It was my pleasure to see Trevonte today and participate in his care. Please feel free to contact me with any questions or  concerns.  Sincerely,  Wyline Mood, DO Allergy & Immunology  [facility] office: [phone] [facility] office: [phone]

## 2023-08-03 ENCOUNTER — Ambulatory Visit (INDEPENDENT_AMBULATORY_CARE_PROVIDER_SITE_OTHER): Payer: BC Managed Care – PPO | Admitting: Allergy

## 2023-08-03 ENCOUNTER — Encounter: Payer: Self-pay | Admitting: Allergy

## 2023-08-03 VITALS — BP 110/76 | HR 67 | Temp 98.0°F | Ht 70.0 in | Wt 215.0 lb

## 2023-08-03 DIAGNOSIS — J3089 Other allergic rhinitis: Secondary | ICD-10-CM

## 2023-08-03 DIAGNOSIS — J301 Allergic rhinitis due to pollen: Secondary | ICD-10-CM

## 2023-08-03 DIAGNOSIS — T50995D Adverse effect of other drugs, medicaments and biological substances, subsequent encounter: Secondary | ICD-10-CM

## 2023-08-03 DIAGNOSIS — H1013 Acute atopic conjunctivitis, bilateral: Secondary | ICD-10-CM

## 2023-08-03 DIAGNOSIS — J3081 Allergic rhinitis due to animal (cat) (dog) hair and dander: Secondary | ICD-10-CM | POA: Diagnosis not present

## 2023-08-03 DIAGNOSIS — J329 Chronic sinusitis, unspecified: Secondary | ICD-10-CM

## 2023-08-03 MED ORDER — RYALTRIS 665-25 MCG/ACT NA SUSP: 1.0000 | Freq: Two times a day (BID) | NASAL | 11 refills | Status: DC

## 2023-08-03 NOTE — Patient Instructions (Addendum)
Environmental allergies 2022 skin testing positive to grass, mold, dust mites, cat. Continue environmental control measures as below. May use Ryaltris (olopatadine + mometasone nasal spray combination) 1-2 sprays per nostril twice a day. Sample given.  Nasal saline spray (i.e., Simply Saline) or nasal saline lavage (i.e., NeilMed) is recommended as needed and prior to medicated nasal sprays. Consider allergy injections for long term control if above medications do not help the symptoms.  Sinus infections Keep track of infections/antibiotics use.  Follow up in 12 months or sooner if needed.   Reducing Pollen Exposure Pollen seasons: trees (spring), grass (summer) and ragweed/weeds (fall). Keep windows closed in your home and car to lower pollen exposure.  Install air conditioning in the bedroom and throughout the house if possible.  Avoid going out in dry windy days - especially early morning. Pollen counts are highest between 5 - 10 AM and on dry, hot and windy days.  Save outside activities for late afternoon or after a heavy rain, when pollen levels are lower.  Avoid mowing of grass if you have grass pollen allergy. Be aware that pollen can also be transported indoors on people and pets.  Dry your clothes in an automatic dryer rather than hanging them outside where they might collect pollen.  Rinse hair and eyes before bedtime. Mold Control Mold and fungi can grow on a variety of surfaces provided certain temperature and moisture conditions exist.  Outdoor molds grow on plants, decaying vegetation and soil. The major outdoor mold, Alternaria and Cladosporium, are found in very high numbers during hot and dry conditions. Generally, a late summer - fall peak is seen for common outdoor fungal spores. Rain will temporarily lower outdoor mold spore count, but counts rise rapidly when the rainy period ends. The most important indoor molds are Aspergillus and Penicillium. Dark, humid and poorly  ventilated basements are ideal sites for mold growth. The next most common sites of mold growth are the bathroom and the kitchen. Outdoor (Seasonal) Mold Control Use air conditioning and keep windows closed. Avoid exposure to decaying vegetation. Avoid leaf raking. Avoid grain handling. Consider wearing a face mask if working in moldy areas.  Indoor (Perennial) Mold Control  Maintain humidity below 50%. Get rid of mold growth on hard surfaces with water, detergent and, if necessary, 5% bleach (do not mix with other cleaners). Then dry the area completely. If mold covers an area more than 10 square feet, consider hiring an indoor environmental professional. For clothing, washing with soap and water is best. If moldy items cannot be cleaned and dried, throw them away. Remove sources e.g. contaminated carpets. Repair and seal leaking roofs or pipes. Using dehumidifiers in damp basements may be helpful, but empty the water and clean units regularly to prevent mildew from forming. All rooms, especially basements, bathrooms and kitchens, require ventilation and cleaning to deter mold and mildew growth. Avoid carpeting on concrete or damp floors, and storing items in damp areas. Control of House Dust Mite Allergen Dust mite allergens are a common trigger of allergy and asthma symptoms. While they can be found throughout the house, these microscopic creatures thrive in warm, humid environments such as bedding, upholstered furniture and carpeting. Because so much time is spent in the bedroom, it is essential to reduce mite levels there.  Encase pillows, mattresses, and box springs in special allergen-proof fabric covers or airtight, zippered plastic covers.  Bedding should be washed weekly in hot water (130 F) and dried in a hot dryer. Allergen-proof  covers are available for comforters and pillows that can't be regularly washed.  Wash the allergy-proof covers every few months. Minimize clutter in the  bedroom. Keep pets out of the bedroom.  Keep humidity less than 50% by using a dehumidifier or air conditioning. You can buy a humidity measuring device called a hygrometer to monitor this.  If possible, replace carpets with hardwood, linoleum, or washable area rugs. If that's not possible, vacuum frequently with a vacuum that has a HEPA filter. Remove all upholstered furniture and non-washable window drapes from the bedroom. Remove all non-washable stuffed toys from the bedroom.  Wash stuffed toys weekly. Pet Allergen Avoidance: Contrary to popular opinion, there are no "hypoallergenic" breeds of dogs or cats. That is because people are not allergic to an animal's hair, but to an allergen found in the animal's saliva, dander (dead skin flakes) or urine. Pet allergy symptoms typically occur within minutes. For some people, symptoms can build up and become most severe 8 to 12 hours after contact with the animal. People with severe allergies can experience reactions in public places if dander has been transported on the pet owners' clothing. Keeping an animal outdoors is only a partial solution, since homes with pets in the yard still have higher concentrations of animal allergens. Before getting a pet, ask your allergist to determine if you are allergic to animals. If your pet is already considered part of your family, try to minimize contact and keep the pet out of the bedroom and other rooms where you spend a great deal of time. As with dust mites, vacuum carpets often or replace carpet with a hardwood floor, tile or linoleum. High-efficiency particulate air (HEPA) cleaners can reduce allergen levels over time. While dander and saliva are the source of cat and dog allergens, urine is the source of allergens from rabbits, hamsters, mice and [place] pigs; so ask a non-allergic family member to clean the animal's cage. If you have a pet allergy, talk to your allergist about the potential for allergy  immunotherapy (allergy shots). This strategy can often provide long-term relief.

## 2023-08-04 ENCOUNTER — Other Ambulatory Visit (HOSPITAL_COMMUNITY): Payer: Self-pay

## 2023-08-04 ENCOUNTER — Telehealth: Payer: Self-pay

## 2023-08-04 NOTE — Telephone Encounter (Signed)
*  Allergy/Asthma  Pharmacy Patient Advocate Encounter  Received notification from [employer] that Prior Authorization for Ryaltris 665-25MCG/ACT suspension  has been APPROVED from 08/04/2023 to 08/02/2024. Ran test claim, Copay is $44.99. This test claim was processed through [facility]- copay amounts may vary at other pharmacies due to pharmacy/plan contracts, or as the patient moves through the different stages of their insurance plan.   PA #/Case ID/Reference #: [id number]

## 2024-07-31 NOTE — Progress Notes (Unsigned)
 Follow Up Note  RE: Antonio Black MRN: [id number] DOB: 1986/07/18 Date of Office Visit: 08/01/2024  Referring provider: Burney Darice CROME, MD Primary care provider: Burney Darice CROME, MD  Chief Complaint: No chief complaint on file.  History of Present Illness: I had the pleasure of seeing Antonio Black for a follow up visit at the [facility] on 08/01/2024. He is a 38 y.o. male, who is being followed for allergic rhinoconjunctivitis and frequent sinusitis. His previous allergy  office visit was on 08/03/2023 with Dr. Luke. Today is a regular follow up visit.  Discussed the use of AI scribe software for clinical note transcription with the patient, who gave verbal consent to proceed.  History of Present Illness            ***  Assessment and Plan: Antonio Black is a 38 y.o. 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  Tried Zyrtec, Sudafed, Flonase  with some benefit.  Usually gets multiple sinus infections per year requiring antibiotics.  No prior ENT evaluation. 2022 skin testing positive to grass, mold, dust mites, cat.  Not interested in AIT.  Interim history -  stopped Singulair  due to side effect concerns. Ryaltris  helps. Antihistamines cause drowsiness.  Continue environmental control measures as below. May use Ryaltris  (olopatadine + mometasone nasal spray combination) 1-2 sprays per nostril twice a day. Sample given.  Nasal saline spray (i.e., Simply Saline) or nasal saline lavage (i.e., NeilMed) is recommended as needed and prior to medicated nasal sprays. Consider allergy  injections for long term control if above medications do not help the symptoms.   Frequent episodes of sinusitis No sinus infections since last  OV. Keep track of infections/antibiotics use. Assessment and Plan              No follow-ups on file.  No orders of the defined types were placed in this encounter.  Lab Orders  No laboratory test(s) ordered today    Diagnostics: Spirometry:  Tracings reviewed. His effort: {Blank single:19197::Good reproducible efforts.,It was hard to get consistent efforts and there is a question as to whether this reflects a maximal maneuver.,Poor effort, data can not be interpreted.} FVC: ***L FEV1: ***L, ***% predicted FEV1/FVC ratio: ***% Interpretation: {Blank single:19197::Spirometry consistent with mild obstructive disease,Spirometry consistent with moderate obstructive disease,Spirometry consistent with severe obstructive disease,Spirometry consistent with possible restrictive disease,Spirometry consistent with mixed obstructive and restrictive disease,Spirometry uninterpretable due to technique,Spirometry consistent with normal pattern,No overt abnormalities noted given today's efforts}.  Please see scanned spirometry results for details.  Skin Testing: {Blank single:19197::Select foods,Environmental allergy  panel,Environmental allergy  panel and select foods,Food allergy  panel,None,Deferred due to recent antihistamines use}. *** Results discussed with patient/family.   Medication List:  Current Outpatient Medications  Medication Sig Dispense Refill  . Olopatadine-Mometasone (RYALTRIS ) 665-25 MCG/ACT SUSP Place 1-2 sprays into the nose in the morning and at bedtime. 29 g 11   No current facility-administered medications for this visit.   Allergies: Allergies  Allergen Reactions  . Amoxicillin  Diarrhea  . Bupropion Cough, Hives, Itching, Nausea Only and Rash   I reviewed his past medical history, social history, family history, and environmental history and no significant changes have been reported from his previous visit.  Review of Systems   Constitutional:  Negative for appetite change, chills,  fever and unexpected weight change.  HENT:  Negative for congestion, postnasal drip, rhinorrhea and sneezing.   Eyes:  Negative for itching.  Respiratory:  Negative for cough, chest tightness, shortness of breath and wheezing.   Cardiovascular:  Negative for chest pain.  Gastrointestinal:  Negative for abdominal pain.  Genitourinary:  Negative for difficulty urinating.  Skin:  Negative for rash.  Allergic/Immunologic: Positive for environmental allergies. Negative for food allergies.  Neurological:  Negative for headaches.    Objective: There were no vitals taken for this visit. There is no height or weight on file to calculate BMI. Physical Exam Vitals and nursing note reviewed.  Constitutional:      Appearance: Normal appearance. He is well-developed.  HENT:     Head: Normocephalic and atraumatic.     Right Ear: Tympanic membrane and external ear normal.     Left Ear: Tympanic membrane and external ear normal.     Nose: Nose normal.     Mouth/Throat:     Mouth: Mucous membranes are moist.     Pharynx: Oropharynx is clear.  Eyes:     Conjunctiva/sclera: Conjunctivae normal.  Cardiovascular:     Rate and Rhythm: Normal rate and regular rhythm.     Heart sounds: Normal heart sounds. No murmur heard.    No friction rub. No gallop.  Pulmonary:     Effort: Pulmonary effort is normal.     Breath sounds: Normal breath sounds. No wheezing, rhonchi or rales.  Musculoskeletal:     Cervical back: Neck supple.  Skin:    General: Skin is warm.     Findings: No rash.  Neurological:     Mental Status: He is alert and oriented to person, place, and time.  Psychiatric:        Behavior: Behavior normal.   Previous notes and tests were reviewed. The plan was reviewed with the patient/family, and all questions/concerned were addressed.  It was my pleasure to see Antonio Black today and participate in his care. Please feel free to contact  me with any questions or concerns.  Sincerely,  Orlan Cramp, DO Allergy  & Immunology  [facility] [place]  [place] office: [phone] [facility] office: [phone]

## 2024-08-01 ENCOUNTER — Ambulatory Visit: Payer: BC Managed Care – PPO | Admitting: Allergy

## 2024-08-01 ENCOUNTER — Encounter: Payer: Self-pay | Admitting: Allergy

## 2024-08-01 ENCOUNTER — Other Ambulatory Visit: Payer: Self-pay

## 2024-08-01 VITALS — BP 110/72 | HR 63 | Temp 97.7°F | Resp 20 | Wt 222.8 lb

## 2024-08-01 DIAGNOSIS — J301 Allergic rhinitis due to pollen: Secondary | ICD-10-CM

## 2024-08-01 DIAGNOSIS — J3081 Allergic rhinitis due to animal (cat) (dog) hair and dander: Secondary | ICD-10-CM

## 2024-08-01 DIAGNOSIS — H1013 Acute atopic conjunctivitis, bilateral: Secondary | ICD-10-CM | POA: Diagnosis not present

## 2024-08-01 DIAGNOSIS — J329 Chronic sinusitis, unspecified: Secondary | ICD-10-CM | POA: Diagnosis not present

## 2024-08-01 DIAGNOSIS — T7840XA Allergy, unspecified, initial encounter: Secondary | ICD-10-CM

## 2024-08-01 DIAGNOSIS — T7840XD Allergy, unspecified, subsequent encounter: Secondary | ICD-10-CM

## 2024-08-01 DIAGNOSIS — J3089 Other allergic rhinitis: Secondary | ICD-10-CM | POA: Diagnosis not present

## 2024-08-01 NOTE — Patient Instructions (Addendum)
 Environmental allergies 2022 skin testing positive to grass, mold, dust mites, cat. Continue environmental control measures as below. May use benadryl as before if only using it a few times per year.   Sinus infections Keep track of infections/antibiotics use.  Allergic reaction - unknown trigger. Possible alpha-gal allergy . If this happens again let me know. Write down what you had done/eaten during the episode. For mild symptoms you can take over the counter antihistamines (zyrtec 10mg  to 20mg ) and monitor symptoms closely.  If symptoms worsen or if you have severe symptoms including breathing issues, throat closure, significant swelling, whole body hives, severe diarrhea and vomiting, lightheadedness then seek immediate medical care. Consider getting alpha-gal labwork in the future.   Follow up as needed.   Reducing Pollen Exposure Pollen seasons: trees (spring), grass (summer) and ragweed/weeds (fall). Keep windows closed in your home and car to lower pollen exposure.  Install air conditioning in the bedroom and throughout the house if possible.  Avoid going out in dry windy days - especially early morning. Pollen counts are highest between 5 - 10 AM and on dry, hot and windy days.  Save outside activities for late afternoon or after a heavy rain, when pollen levels are lower.  Avoid mowing of grass if you have grass pollen allergy . Be aware that pollen can also be transported indoors on people and pets.  Dry your clothes in an automatic dryer rather than hanging them outside where they might collect pollen.  Rinse hair and eyes before bedtime. Mold Control Mold and fungi can grow on a variety of surfaces provided certain temperature and moisture conditions exist.  Outdoor molds grow on plants, decaying vegetation and soil. The major outdoor mold, Alternaria and Cladosporium, are found in very high numbers during hot and dry conditions. Generally, a late summer - fall peak is seen  for common outdoor fungal spores. Rain will temporarily lower outdoor mold spore count, but counts rise rapidly when the rainy period ends. The most important indoor molds are Aspergillus and Penicillium. Dark, humid and poorly ventilated basements are ideal sites for mold growth. The next most common sites of mold growth are the bathroom and the kitchen. Outdoor (Seasonal) Mold Control Use air conditioning and keep windows closed. Avoid exposure to decaying vegetation. Avoid leaf raking. Avoid grain handling. Consider wearing a face mask if working in moldy areas.  Indoor (Perennial) Mold Control  Maintain humidity below 50%. Get rid of mold growth on hard surfaces with water, detergent and, if necessary, 5% bleach (do not mix with other cleaners). Then dry the area completely. If mold covers an area more than 10 square feet, consider hiring an indoor environmental professional. For clothing, washing with soap and water is best. If moldy items cannot be cleaned and dried, throw them away. Remove sources e.g. contaminated carpets. Repair and seal leaking roofs or pipes. Using dehumidifiers in damp basements may be helpful, but empty the water and clean units regularly to prevent mildew from forming. All rooms, especially basements, bathrooms and kitchens, require ventilation and cleaning to deter mold and mildew growth. Avoid carpeting on concrete or damp floors, and storing items in damp areas. Control of House Dust Mite Allergen Dust mite allergens are a common trigger of allergy  and asthma symptoms. While they can be found throughout the house, these microscopic creatures thrive in warm, humid environments such as bedding, upholstered furniture and carpeting. Because so much time is spent in the bedroom, it is essential to reduce mite levels there.  Encase pillows, mattresses, and box springs in special allergen-proof fabric covers or airtight, zippered plastic covers.  Bedding should be washed  weekly in hot water (130 F) and dried in a hot dryer. Allergen-proof covers are available for comforters and pillows that can't be regularly washed.  Wash the allergy -proof covers every few months. Minimize clutter in the bedroom. Keep pets out of the bedroom.  Keep humidity less than 50% by using a dehumidifier or air conditioning. You can buy a humidity measuring device called a hygrometer to monitor this.  If possible, replace carpets with hardwood, linoleum, or washable area rugs. If that's not possible, vacuum frequently with a vacuum that has a HEPA filter. Remove all upholstered furniture and non-washable window drapes from the bedroom. Remove all non-washable stuffed toys from the bedroom.  Wash stuffed toys weekly. Pet Allergen Avoidance: Contrary to popular opinion, there are no "hypoallergenic" breeds of dogs or cats. That is because people are not allergic to an animal's hair, but to an allergen found in the animal's saliva, dander (dead skin flakes) or urine. Pet allergy  symptoms typically occur within minutes. For some people, symptoms can build up and become most severe 8 to 12 hours after contact with the animal. People with severe allergies can experience reactions in public places if dander has been transported on the pet owners' clothing. Keeping an animal outdoors is only a partial solution, since homes with pets in the yard still have higher concentrations of animal allergens. Before getting a pet, ask your allergist to determine if you are allergic to animals. If your pet is already considered part of your family, try to minimize contact and keep the pet out of the bedroom and other rooms where you spend a great deal of time. As with dust mites, vacuum carpets often or replace carpet with a hardwood floor, tile or linoleum. High-efficiency particulate air (HEPA) cleaners can reduce allergen levels over time. While dander and saliva are the source of cat and dog allergens, urine is  the source of allergens from rabbits, hamsters, mice and [place] pigs; so ask a non-allergic family member to clean the animal's cage. If you have a pet allergy , talk to your allergist about the potential for allergy  immunotherapy (allergy  shots). This strategy can often provide long-term relief.  Alpha-gal and Red Meat Allergy    Overview An allergy  to "alpha-gal" refers to having a severe and potentially life-threatening allergy  to a carbohydrate molecule called galactose-alpha-1,3-galactose that is found in most mammalian or "red meat". Unlike other food allergies which typically occur within minutes of ingestion, symptoms from eating red meat such as pork, lamb or beef may be delayed, occurring 3-8 hours after eating. Most food allergies are directed against a protein molecule, but alpha-gal is unusual because it is a carbohydrate, and a delay in its absorption may explain the delay in symptoms.  Helpful website: [employer]  What are the symptoms of an alpha-gal allergy ? As with other food allergies, signs or symptoms of an allergy  to alpha-gal may include: Hives and itching  Swelling of your lips, face or eyelids  Shortness of breath, cough or wheezing  Abdominal pain, nausea, diarrhea or vomiting The most severe reaction, anaphylaxis, can present as a combination of several of these symptoms, may include low blood pressure, and is potentially fatal.  Because these symptoms are delayed, you may only wake up with them in the middle of the night after an evening meal.  How is an alpha-gal allergy  diagnosed? Diagnosis of this allergy   starts with your allergist taking an appropriate history and physical examination. Because the onset is usually quite delayed, it can be hard to associate the symptoms with eating red meat many hours previously. Triggers include any red meat - including beef, pork, lamb or even horse products. It may occur after eating hotdogs and hamburgers. In very  rare cases the reaction may extend to milk or dairy proteins and gelatin.  Your allergist may recommend testing that includes skin tests to the relevant animal proteins and blood tests which measure the levels of a specific immunoglobulin E (IgE) antibody, to mammalian meats. An investigational blood test, IgE against alpha-gal itself, may also aid in the diagnosis.  How is an alpha-gal allergy  treated? Immediate symptoms such as hives or shortness of breath are treated the same as any other food allergy  - in an urgent care setting with anti-histamines, epinephrine and other medications. Prevention long-term involves avoidance of all red meat in sensitized individuals. You may be advised to carry an epinephrine auto-injector, to be used in case of subsequent accidental exposures and reaction. These measures do not necessarily mean switching to a full vegetarian diet, since poultry and fish can be consumed and do not cause similar reactions. As with other food allergies, there is the possibility that over time the sensitivity diminishes - although these changes may take many years to become apparent.  How do you become allergic to alpha-gal? Alpha-gal is a molecule carried in the saliva of the [place] tick and other potential arthropods typically after feeding on mammalian blood. People that are bitten by the tick, especially those that are bitten repeatedly, are at risk of becoming sensitized and producing the IgE necessary to then cause allergic reactions. Interestingly, allergic reactions may occur to red meat, to subsequent tick bites, and even to medications that contain alpha-gal. Cetuximab is a cancer medication that contains alpha-gal, and people who have had allergic reactions to this medication (these are typically immediate reactions, because it is infused intravenously) have a higher risk for red meat allergy  and are likely to have been bitten by ticks in the past. As might be expected, the  incidence of tick bites is much higher in the [place] the traditional habitat for the tick. However, cases are now increasingly reported in the [place] and [place]. And it is a phenomenon that has been observed worldwide, with different ticks responsible for similar cases of red meat allergy  in many other countries such as [place], [place] and [place].  The discovery of this peculiar allergy  has allowed researchers to correlate tick bites with many cases of anaphylaxis that would previously have been classified as 'idiopathic', or of unknown cause. Also, while it was originally thought that the [employer] tick had to feast on mammalian blood in order to carry the alpha-gal molecule, more recent research has shown that it may carry this molecule and be capable of sensitizing humans independently.  How do you prevent an alpha-gal allergy ? Because this allergy  is predominantly tick born, you are more likely at risk if you often go outdoors in wooded areas for activities such as hiking, fishing or hunting. The key strategy is to prevent tick bites. This may include wearing long sleeved shirts or pants, using appropriate insect repellants, and surveying for ticks after spending time outdoors. Any observed ticks should be removed carefully by cleaning the site with rubbing alcohol, then using tweezers to pull the tick's head up carefully from the skin using steady  pressure. Clean your hands and the site one more time and make sure not to crush the tick between your fingers.

## 2024-08-30 ENCOUNTER — Ambulatory Visit (INDEPENDENT_AMBULATORY_CARE_PROVIDER_SITE_OTHER)

## 2024-08-30 ENCOUNTER — Encounter: Payer: Self-pay | Admitting: Podiatry

## 2024-08-30 ENCOUNTER — Ambulatory Visit (INDEPENDENT_AMBULATORY_CARE_PROVIDER_SITE_OTHER): Admitting: Podiatry

## 2024-08-30 VITALS — Ht 70.0 in | Wt 222.8 lb

## 2024-08-30 DIAGNOSIS — M7752 Other enthesopathy of left foot: Secondary | ICD-10-CM

## 2024-08-30 DIAGNOSIS — M7751 Other enthesopathy of right foot: Secondary | ICD-10-CM
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
## 2024-09-14 ENCOUNTER — Telehealth: Payer: Self-pay | Admitting: Allergy

## 2024-09-14 DIAGNOSIS — T7819XD Other adverse food reactions, not elsewhere classified, subsequent encounter: Secondary | ICD-10-CM

## 2024-09-14 NOTE — Telephone Encounter (Signed)
 Please call patient.  I ordered the labs.  He can go to [facility] to get it drawn.  He can pick up the order from the office on Tuesday/Thursdays or we can mail to him.

## 2024-09-14 NOTE — Telephone Encounter (Signed)
 PT called stating that he has decided to do  to do alpha-gal lab work and would like  for an order to be put in so he can get the blood work done and to be called to know where and when to do the labs

## 2024-09-14 NOTE — Telephone Encounter (Signed)
 Called the patient and informed, patient verbalized understanding. Labs have been printed and placed in the mail to be sent to the patient's home.
